# Patient Record
Sex: Female | Born: 1985 | Race: White | Hispanic: No | Smoking: Former smoker
Health system: Southern US, Community
[De-identification: ages and names within clinical notes are randomized; demographics above are authoritative.]

## PROBLEM LIST (undated history)

## (undated) DIAGNOSIS — F419 Anxiety disorder, unspecified: Secondary | ICD-10-CM

## (undated) DIAGNOSIS — R519 Headache, unspecified: Secondary | ICD-10-CM

## (undated) DIAGNOSIS — S66929A Laceration of unspecified muscle, fascia and tendon at wrist and hand level, unspecified hand, initial encounter: Secondary | ICD-10-CM

## (undated) DIAGNOSIS — S61419A Laceration without foreign body of unspecified hand, initial encounter: Secondary | ICD-10-CM

## (undated) DIAGNOSIS — R51 Headache: Secondary | ICD-10-CM

## (undated) DIAGNOSIS — J45909 Unspecified asthma, uncomplicated: Secondary | ICD-10-CM

## (undated) DIAGNOSIS — F4310 Post-traumatic stress disorder, unspecified: Secondary | ICD-10-CM

## (undated) DIAGNOSIS — F329 Major depressive disorder, single episode, unspecified: Secondary | ICD-10-CM

## (undated) DIAGNOSIS — S62309B Unspecified fracture of unspecified metacarpal bone, initial encounter for open fracture: Secondary | ICD-10-CM

## (undated) DIAGNOSIS — F32A Depression, unspecified: Secondary | ICD-10-CM

## (undated) HISTORY — PX: HAND SURGERY: SHX662

## (undated) HISTORY — PX: BONE TUMOR EXCISION: SHX1254

## (undated) HISTORY — PX: WISDOM TOOTH EXTRACTION: SHX21

## (undated) HISTORY — PX: TUBAL LIGATION: SHX77

---
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
## 2006-01-03 ENCOUNTER — Other Ambulatory Visit: Admission: RE | Admit: 2006-01-03 | Discharge: 2006-01-03 | Payer: Self-pay | Admitting: Obstetrics and Gynecology

## 2006-01-12 ENCOUNTER — Encounter (INDEPENDENT_AMBULATORY_CARE_PROVIDER_SITE_OTHER): Payer: Self-pay | Admitting: *Deleted

## 2006-01-12 ENCOUNTER — Ambulatory Visit (HOSPITAL_COMMUNITY): Admission: RE | Admit: 2006-01-12 | Discharge: 2006-01-12 | Payer: Self-pay | Admitting: Obstetrics and Gynecology

## 2006-01-12 HISTORY — PX: DILATION AND CURETTAGE OF UTERUS: SHX78

## 2006-04-14 ENCOUNTER — Inpatient Hospital Stay (HOSPITAL_COMMUNITY): Admission: AD | Admit: 2006-04-14 | Discharge: 2006-04-15 | Payer: Self-pay | Admitting: Obstetrics and Gynecology

## 2006-04-20 ENCOUNTER — Emergency Department (HOSPITAL_COMMUNITY): Admission: EM | Admit: 2006-04-20 | Discharge: 2006-04-21 | Payer: Self-pay | Admitting: Emergency Medicine

## 2006-05-21 ENCOUNTER — Inpatient Hospital Stay (HOSPITAL_COMMUNITY): Admission: AD | Admit: 2006-05-21 | Discharge: 2006-05-21 | Payer: Self-pay | Admitting: Obstetrics and Gynecology

## 2006-06-21 ENCOUNTER — Inpatient Hospital Stay (HOSPITAL_COMMUNITY): Admission: AD | Admit: 2006-06-21 | Discharge: 2006-06-21 | Payer: Self-pay | Admitting: Obstetrics and Gynecology

## 2006-09-27 ENCOUNTER — Observation Stay (HOSPITAL_COMMUNITY): Admission: AD | Admit: 2006-09-27 | Discharge: 2006-09-28 | Payer: Self-pay | Admitting: Obstetrics and Gynecology

## 2006-10-12 ENCOUNTER — Encounter: Admission: RE | Admit: 2006-10-12 | Discharge: 2006-10-30 | Payer: Self-pay | Admitting: Obstetrics and Gynecology

## 2006-11-22 ENCOUNTER — Inpatient Hospital Stay (HOSPITAL_COMMUNITY): Admission: AD | Admit: 2006-11-22 | Discharge: 2006-11-22 | Payer: Self-pay | Admitting: Obstetrics and Gynecology

## 2006-11-25 ENCOUNTER — Inpatient Hospital Stay (HOSPITAL_COMMUNITY): Admission: AD | Admit: 2006-11-25 | Discharge: 2006-11-25 | Payer: Self-pay | Admitting: Obstetrics and Gynecology

## 2006-12-06 ENCOUNTER — Inpatient Hospital Stay (HOSPITAL_COMMUNITY): Admission: AD | Admit: 2006-12-06 | Discharge: 2006-12-09 | Payer: Self-pay | Admitting: Obstetrics and Gynecology

## 2007-03-11 ENCOUNTER — Ambulatory Visit: Payer: Self-pay | Admitting: Family Medicine

## 2007-07-14 ENCOUNTER — Emergency Department (HOSPITAL_COMMUNITY): Admission: EM | Admit: 2007-07-14 | Discharge: 2007-07-14 | Payer: Self-pay | Admitting: Emergency Medicine

## 2007-07-24 ENCOUNTER — Emergency Department (HOSPITAL_COMMUNITY): Admission: EM | Admit: 2007-07-24 | Discharge: 2007-07-24 | Payer: Self-pay | Admitting: Emergency Medicine

## 2008-07-11 ENCOUNTER — Inpatient Hospital Stay (HOSPITAL_COMMUNITY): Admission: AD | Admit: 2008-07-11 | Discharge: 2008-07-11 | Payer: Self-pay | Admitting: Obstetrics and Gynecology

## 2008-09-17 ENCOUNTER — Ambulatory Visit (HOSPITAL_COMMUNITY): Admission: RE | Admit: 2008-09-17 | Discharge: 2008-09-17 | Payer: Self-pay | Admitting: Obstetrics and Gynecology

## 2008-10-08 ENCOUNTER — Ambulatory Visit (HOSPITAL_COMMUNITY): Admission: RE | Admit: 2008-10-08 | Discharge: 2008-10-08 | Payer: Self-pay | Admitting: Obstetrics and Gynecology

## 2008-10-12 ENCOUNTER — Inpatient Hospital Stay (HOSPITAL_COMMUNITY): Admission: AD | Admit: 2008-10-12 | Discharge: 2008-10-12 | Payer: Self-pay | Admitting: Obstetrics and Gynecology

## 2008-10-27 ENCOUNTER — Observation Stay (HOSPITAL_COMMUNITY): Admission: AD | Admit: 2008-10-27 | Discharge: 2008-10-28 | Payer: Self-pay | Admitting: Obstetrics and Gynecology

## 2008-11-06 ENCOUNTER — Inpatient Hospital Stay (HOSPITAL_COMMUNITY): Admission: AD | Admit: 2008-11-06 | Discharge: 2008-11-06 | Payer: Self-pay | Admitting: Obstetrics and Gynecology

## 2008-11-16 ENCOUNTER — Inpatient Hospital Stay (HOSPITAL_COMMUNITY): Admission: AD | Admit: 2008-11-16 | Discharge: 2008-11-19 | Payer: Self-pay | Admitting: Obstetrics and Gynecology

## 2008-11-17 ENCOUNTER — Encounter (INDEPENDENT_AMBULATORY_CARE_PROVIDER_SITE_OTHER): Payer: Self-pay | Admitting: Obstetrics and Gynecology

## 2009-07-09 ENCOUNTER — Ambulatory Visit (HOSPITAL_COMMUNITY): Admission: RE | Admit: 2009-07-09 | Discharge: 2009-07-09 | Payer: Self-pay | Admitting: Family Medicine

## 2009-08-08 ENCOUNTER — Emergency Department (HOSPITAL_COMMUNITY): Admission: EM | Admit: 2009-08-08 | Discharge: 2009-08-08 | Payer: Self-pay | Admitting: Emergency Medicine

## 2010-03-06 ENCOUNTER — Encounter: Payer: Self-pay | Admitting: Obstetrics and Gynecology

## 2010-05-19 LAB — CCBB MATERNAL DONOR DRAW

## 2010-05-19 LAB — CBC
HCT: 26.4 % — ABNORMAL LOW (ref 36.0–46.0)
HCT: 31.7 % — ABNORMAL LOW (ref 36.0–46.0)
Hemoglobin: 10.2 g/dL — ABNORMAL LOW (ref 12.0–15.0)
Hemoglobin: 8.6 g/dL — ABNORMAL LOW (ref 12.0–15.0)
MCHC: 32.2 g/dL (ref 30.0–36.0)
MCHC: 32.8 g/dL (ref 30.0–36.0)
MCV: 80.2 fL (ref 78.0–100.0)
MCV: 80.2 fL (ref 78.0–100.0)
Platelets: 140 10*3/uL — ABNORMAL LOW (ref 150–400)
Platelets: 178 10*3/uL (ref 150–400)
RBC: 3.29 MIL/uL — ABNORMAL LOW (ref 3.87–5.11)
RBC: 3.96 MIL/uL (ref 3.87–5.11)
RDW: 14 % (ref 11.5–15.5)
RDW: 14.3 % (ref 11.5–15.5)
WBC: 12.8 10*3/uL — ABNORMAL HIGH (ref 4.0–10.5)
WBC: 15.9 10*3/uL — ABNORMAL HIGH (ref 4.0–10.5)

## 2010-05-19 LAB — RPR: RPR Ser Ql: NONREACTIVE

## 2010-05-20 LAB — CBC
HCT: 29 % — ABNORMAL LOW (ref 36.0–46.0)
Hemoglobin: 9.7 g/dL — ABNORMAL LOW (ref 12.0–15.0)
MCHC: 33.6 g/dL (ref 30.0–36.0)
MCV: 82.7 fL (ref 78.0–100.0)
Platelets: 212 10*3/uL (ref 150–400)
RBC: 3.5 MIL/uL — ABNORMAL LOW (ref 3.87–5.11)
RDW: 13.7 % (ref 11.5–15.5)
WBC: 9.5 10*3/uL (ref 4.0–10.5)

## 2010-05-20 LAB — STREP B DNA PROBE: Strep Group B Ag: NEGATIVE

## 2010-05-21 LAB — URINALYSIS, ROUTINE W REFLEX MICROSCOPIC
Bilirubin Urine: NEGATIVE
Glucose, UA: NEGATIVE mg/dL
Hgb urine dipstick: NEGATIVE
Ketones, ur: NEGATIVE mg/dL
Nitrite: NEGATIVE
Protein, ur: NEGATIVE mg/dL
Specific Gravity, Urine: 1.015 (ref 1.005–1.030)
Urobilinogen, UA: 0.2 mg/dL (ref 0.0–1.0)
pH: 6.5 (ref 5.0–8.0)

## 2010-05-21 LAB — URINE MICROSCOPIC-ADD ON

## 2010-05-24 LAB — URINALYSIS, ROUTINE W REFLEX MICROSCOPIC
Bilirubin Urine: NEGATIVE
Glucose, UA: NEGATIVE mg/dL
Hgb urine dipstick: NEGATIVE
Ketones, ur: 40 mg/dL — AB
Nitrite: NEGATIVE
Protein, ur: NEGATIVE mg/dL
Specific Gravity, Urine: 1.025 (ref 1.005–1.030)
Urobilinogen, UA: 1 mg/dL (ref 0.0–1.0)
pH: 6.5 (ref 5.0–8.0)

## 2010-07-01 NOTE — Op Note (Signed)
NAME:  [name], [name]             ACCOUNT NO.:  [id number]   MEDICAL RECORD NO.:  [id number]          PATIENT TYPE:  AMB   LOCATION:  SDC                           F[facility]ILITY:  [facility]   PHYSICIAN:  James A. Ashley Royalty, M.D.DATE OF BIRTH:  Apr 08, 1985   DATE OF PROCEDURE:  01/12/2006  DATE OF DISCHARGE:  01/12/2006                               OPERATIVE REPORT   PREOPERATIVE DIAGNOSIS:  Missed abortion at approximately [redacted] weeks  gestation with twin gestation.   POSTOPERATIVE DIAGNOSIS:  Missed abortion at approximately [redacted] weeks  gestation with twin gestation.  Pathology pending.   PROCEDURE:  Suction dilatation and curettage.   SURGEON:  Rudy Jew. Ashley Royalty, MD   ANESTHESIA:  General.   ESTIMATED BLOOD LOSS:  50 mL.   COMPLICATIONS:  None.   PACKS/DRAINS:  None.   PROCEDURE:  The patient was taken to the operating room and placed in  the dorsal supine position.  After general anesthetic was administered,  she was placed in the lithotomy position and prepped and draped in usual  manner for vaginal surgery.  Posterior weighted retractor was placed per  vagina.  The anterior lip of the cervix was grasped with single-tooth  tenaculum.  The uterus was sounded to approximately 9 cm.  The cervix  was then serially dilated to a size 27-French using Pratt dilators.  9  mm suction curette was then introduced into the uterine cavity and  suction applied.  A moderate amount of apparent products of conception  was delivered to the tubing.  After several passes with the suction  curette, no additional tissue was obtained.  At this point medium size  curette was introduced into the uterine cavity and a gentle sharp  curettage was performed.  No additional tissue was obtained.  One  additional pass with the suction curette again revealed no additional  tissue.  This point the patient was felt to have benefited maximally  from the surgical procedure.  The single-tooth tenaculum was removed and  a  small laceration at the tenaculum site easily closed with a 2-0  chromic catgut interrupted suture.  Hemostasis was noted and procedure  terminated.   The patient was taken to the recovery room in excellent condition.      James A. Ashley Royalty, M.D.  Electronically Signed    JAM/MEDQ  D:  01/14/2006  T:  01/15/2006  Job:  [id number]

## 2010-07-01 NOTE — Discharge Summary (Signed)
NAMECHRISANNE, [name]              ACCOUNT NO.:  [id number]   MEDICAL RECORD NO.:  [id number]          PATIENT TYPE:  INP   LOCATION:  9127                          [facility]ILITY:  [facility]   PHYSICIAN:  Huel Cote, M.D. DATE OF BIRTH:  01/07/1986   DATE OF ADMISSION:  12/06/2006  DATE OF DISCHARGE:  12/09/2006                               DISCHARGE SUMMARY   DISCHARGE DIAGNOSES:  1. Term pregnancy at 70 and four-sevenths weeks delivered.  2. Status post normal spontaneous vaginal delivery.   DISCHARGE MEDICATIONS:  1. Motrin 600 mg p.o. every 6 hours.  2. Percocet one to two tablets p.o. every 4 hours p.r.n.   DISCHARGE FOLLOWUP:  The patient is to follow up in the office in 6  weeks for her routine postpartum exam.   HOSPITAL COURSE:  The patient is a 25 year old G2, P0-0-1-0 who was  admitted at 53 and four-sevenths weeks with painful regular  contractions.  Her pregnancy had been largely uncomplicated and she was  admitted for observation x1 with an abdominal trauma in the pregnancy  with no negative consequences.  Past medical history:  Anxiety,  comfortable without medications; asthma, no medications.  Past surgical  history:  D&C.  Past obstetrical history:  She had had one SAB which  required a D&C as stated.  Past GYN history:  None.  Medications  included albuterol p.r.n. and prenatal vitamins.  Allergies included  some STEROIDS and LATEX.  Prenatal labs are as follows:  A positive,  antibody negative, RPR nonreactive, rubella immune, hepatitis B surface  antigen negative, HIV negative, GC negative, chlamydia negative, group B  strep negative, 1-hour Glucola 87.  On admission, she was afebrile with  stable vital signs.  Fetal heart rate was reactive.  Cervix was 1, 50  and a -2 and progressed to 2, 80 and a -2.  She had assisted rupture of  membranes with clear fluid noted.  Cervix at that time was 2-3 and 90.  She continued to progress well and reached complete dilation,  pushed  excellently, with a normal spontaneous vaginal delivery of a vigorous  female infant over an intact perineum.  Apgars were 8 and 9, weight was  6 pounds 7 ounces.  Placenta delivered spontaneously.  She had a small  right laceration which was repaired with interrupted 3-0 Vicryl for  hemostasis.  Cervix, rectum and vagina were all intact.  Postpartum  hemoglobin was 8.2, down from 10.2.  By postpartum day #2 she was doing  quite well, she had no complaints and her pain was well controlled with  p.o. medications, and she was felt stable for discharge home.      Huel Cote, M.D.  Electronically Signed     KR/MEDQ  D:  01/17/2007  T:  01/17/2007  Job:  [id number]

## 2010-07-01 NOTE — H&P (Signed)
NAME:  [name], [name]             ACCOUNT NO.:  [id number]   MEDICAL RECORD NO.:  [id number]          PATIENT TYPE:  AMB   LOCATION:  SDC                           [facility]   PHYSICIAN:  James A. Ashley Royalty, M.D.DATE OF BIRTH:  1985-12-24   DATE OF ADMISSION:  01/12/2006  DATE OF DISCHARGE:                              HISTORY & PHYSICAL   Please have this dictation available on the outpatient surgery area at  [facility] at 3 p.m.   This is a 25 year old gravid 1, who presented for ultrasound yesterday  in order to date her early pregnancy.  The ultrasound revealed a twin  gestation at or about 9 weeks 2 days with demise of both twins.  The  patient is hence for suction dilatation and curettage.   MEDICATIONS:  Prenatal vitamins.   PAST MEDICAL HISTORY:  1. Medical negative.  2. Surgical negative.   ALLERGIES:  LATEX and possibly STEROIDS.   FAMILY HISTORY:  Noncontributory.   SOCIAL HISTORY:  The patient denies the use of tobacco or significant  alcohol.   REVIEW OF SYSTEMS:  Noncontributory.   PHYSICAL EXAMINATION:  GENERAL:  Well-developed, well-nourished pleasant  white female in no acute distress.  VITAL SIGNS:  Afebrile, vital signs stable.  CHEST:  Lungs are clear.  CARDIAC:  Regular rate and rhythm.  ABDOMEN:  Soft and nontender.  PELVIC:  Please see most recent office evaluation.  EXTERNAL GENITALIA:  Within normal limits.  VAGINA AND CERVIX:  Without gross lesions.  Bimanual examination reveals  uterus to be approximately 8 x 4.4 cm, and no adnexal masses are  palpable.   IMPRESSION:  1. Intrauterine pregnancy at approximately 9 weeks 2 days gestation.  2. Twin gestation.  3. Fetal demise of both twins (missed abortion).   PLAN:  Suction dilatation and curettage.  Risks, benefits,  complications, and alternatives were discussed with the patient.  She  states she understands and accepts.  Questions invited and answered.      James A.  Ashley Royalty, M.D.  Electronically Signed     JAM/MEDQ  D:  01/12/2006  T:  01/12/2006  Job:  [id number]

## 2010-07-01 NOTE — H&P (Signed)
NAME:  [name], [name] NO.:  [id number]   MEDICAL RECORD NO.:  [id number]          PATIENT TYPE:  IPS   LOCATION:  0501                          [facility]   PHYSICIAN:  Anselm Jungling, MD  DATE OF BIRTH:  05-25-1985   DATE OF ADMISSION:  01/19/2005  DATE OF DISCHARGE:                         PSYCHIATRIC ADMISSION ASSESSMENT   IDENTIFYING INFORMATION:  An 25 year old single white female, voluntarily  admitted on January 19, 2005.   HISTORY OF PRESENT ILLNESS:  The patient presents with a history of  depression with a self-inflicted injury.  The patient has been cutting  herself with a razor.  The patient states that circumstances prior to  admission, she had gone for her Depo-Provera injection.  Providers there  asked the patient if she was having any symptoms of depression.  The patient  said that she was.  They were concerned and sent her to the mental health  center for assessment.  The patient reports she has been having symptoms of  feeling very angry, hates her life and wants to die.  The patient has been  experiencing positive auditory hallucinations, hearing conversations.  She  is having difficulty with initial insomnia.  Her appetite has been  satisfactory.  The patient reports that she also has a history of biting her  nails, picking at her skin because of ongoing anxiety.  She has a history of  punching walls, yelling and instigating fights.   PAST PSYCHIATRIC HISTORY:  First admission to [facility].  In  the past has been on Celexa and Paxil.   SOCIAL HISTORY:  She is an 25 year old female with no children, currently  living with her parents.  Has a 12th grade education.  Works in a [employer], [employer].  No legal issues.   FAMILY HISTORY:  Father with depression, sister who is bipolar.   ALCOHOL DRUG HISTORY:  The patient smokes.  She takes pain pills on  occasion.  No consistent alcohol use.   PAST MEDICAL HISTORY:   Primary care Amijah Timothy:  None.  Medical problems are  none.   MEDICATIONS:  The patient reports has been on Celexa, Zyprexa and Wellbutrin  in the past.  Last time she has been on medication was at the age of 43.   DRUG ALLERGIES:  LATEX.   PHYSICAL EXAMINATION:  Review of systems:  Denies any chest pain, shortness  of breath, nausea and vomiting, sexually active, currently on birth control  injection.  She is reporting depression and anxiety with suicidal ideation,  self-inflicted injuries.  GENERAL APPEARANCE:  This is a healthy young female in no acute distress.  Her temperature is 96.5, 86 heart rate, 20 respirations, blood pressure  107/69.  She is 129 pounds, 5 feet 3 inches tall.  Negative lymphadenopathy.  CHEST:  Clear.  HEART:  Regular rate and rhythm.  ABDOMEN:  Soft, nontender abdomen.  EXTREMITIES:  The patient moves all extremities, no clubbing or deformities,  no contusions.  SKIN:  Warm and dry.  The patient has a tattoo in the back on her neck.  There are superficial lacerations to  her left wrist.  NEUROLOGICAL:  Intact, nonfocal.  Easily performs heel-to-shin, normal  alternating movements.   LABORATORY DATA:  Her CBC is within normal limits.  CMET within normal  limits.  TSH is 1.933.   MENTAL STATUS EXAM:  She is alert, cooperative female, good eye contact,  casually dressed.  Speech is clear.  The patient feels depressed and  anxious.  The patient does appear with some mild anxiety.  Thought processes  are coherent, the patient is endorsing positive auditory hallucinations  although does not appear to be actively responding at this time.  Cognitive  function intact.  Memory is good, judgment is fair, insight is fair.  Poor  impulse control, average intelligence, and concentration is intact.   ADMISSION DIAGNOSES:  AXIS I:  Mood disorder not otherwise specified, rule  out bipolar disorder not otherwise specified.  AXIS II:  Deferred.  AXIS III:  None.  AXIS  IV:  Other psychosocial problems.  AXIS V:  Current is 30.   PLAN:  Plan is to stabilize mood and thinking, contract for safety.  We will  start Depakote and Risperdal, will have a family session with parents for  concerns and education.  The patient may need an  individual therapist.   TENTATIVE LENGTH OF CARE:  3-4 days.      Landry Corporal, N.P.      Anselm Jungling, MD  Electronically Signed    JO/MEDQ  D:  01/23/2005  T:  01/23/2005  Job:  [id number]

## 2010-07-01 NOTE — Discharge Summary (Signed)
NAME:  [name], [name] NO.:  [id number]   MEDICAL RECORD NO.:  [id number]          PATIENT TYPE:  IPS   LOCATION:  0501                          [facility]   PHYSICIAN:  Anselm Jungling, MD  DATE OF BIRTH:  03-05-1985   DATE OF ADMISSION:  01/19/2005  DATE OF DISCHARGE:                                 DISCHARGE SUMMARY   IDENTIFYING DATA AND REASON FOR ADMISSION:  The patient is an 25 year old  single white female who lives with her parents in [place], [place].  She was admitted, her first inpatient psychiatric hospitalization ever, due  to increasing depression, suicidal ideation, and self-inflicted cutting. She  had had similar problems since age 23. She was not at the time in any  outpatient treatment, nor was she taking any psychotropic medication for the  above. She denied any specific stressors. Please refer to the admission note  for further details pertaining to the symptoms, circumstances and history  that led to her hospitalization. 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   MEDICAL AND LABORATORY:  The patient was physically assessed by the  psychiatric nurse practitioner upon admission. There were no significant  medical issues during this brief inpatient psychiatric stay.   HOSPITAL COURSE:  The patient was admitted to the adult inpatient  psychiatric service where she participated in various therapeutic groups and  activities designed to help her acquire better coping skills, better  understanding of her underlying disorders and dynamics, and the development  of an aftercare and safety plan. Due to suspected bipolar disorder, she was  started on a regimen of low-dose Risperdal at 0.25 mg b.i.d., and Depakote  ER 500 mg q.h.s.  These were well tolerated, and the patient reported much  less in the way of anxiety, agitation, improved sleep, and reduction in any  impulses to harm herself. She was a good participant in the treatment  program and got along well with peers and staff alike.   During her stay, there was a family session involving the patient and her  parents. This was a productive meeting in which the antecedents to her  hospitalization were discussed as well as the patient's current treatment  and aftercare needs. The parents were highly motivated to see their daughter  pursue outpatient treatment following her inpatient stabilization.   By the fourth hospital day, the patient indicated that she was absent any  thoughts of self-harm or suicide. She was interested in going home that day,  but it was felt a bit premature as there was greater need for coordination  of her discharge with her parents and aftercare planning.   AFTERCARE:  The patient was discharged on the fifth hospital day. Discharge  medications include Depakote ER 500 mg q.h.s., and Risperdal 0.25 mg b.i.d..  outpatient aftercare appointments had not yet been set up at the time of  this dictation.   DISCHARGE DIAGNOSES:  AXIS I: Depressive disorder, not otherwise specified,  rule out bipolar disorder, type 2.  AXIS II: Borderline personality traits.  AXIS III: No acute or chronic illnesses.  AXIS IV: Stressors severe.  AXIS V: Global assessment of functioning on discharge is 65.           ______________________________  Anselm Jungling, MD  Electronically Signed     SPB/MEDQ  D:  01/23/2005  T:  01/23/2005  Job:  [id number]

## 2010-11-23 LAB — CBC
HCT: 24.1 — ABNORMAL LOW
HCT: 30 — ABNORMAL LOW
Hemoglobin: 10.2 — ABNORMAL LOW
Hemoglobin: 8.2 — ABNORMAL LOW
MCHC: 33.9
MCHC: 34.1
MCV: 80.2
MCV: 84.4
Platelets: 154
Platelets: 175
RBC: 2.86 — ABNORMAL LOW
RBC: 3.74 — ABNORMAL LOW
RDW: 14.3 — ABNORMAL HIGH
RDW: 14.7 — ABNORMAL HIGH
WBC: 15.6 — ABNORMAL HIGH
WBC: 17.6 — ABNORMAL HIGH

## 2010-11-23 LAB — RPR: RPR Ser Ql: NONREACTIVE

## 2010-11-23 LAB — CCBB MATERNAL DONOR DRAW

## 2010-11-24 LAB — URINALYSIS, ROUTINE W REFLEX MICROSCOPIC
Bilirubin Urine: NEGATIVE
Bilirubin Urine: NEGATIVE
Glucose, UA: NEGATIVE
Glucose, UA: NEGATIVE
Hgb urine dipstick: NEGATIVE
Hgb urine dipstick: NEGATIVE
Ketones, ur: NEGATIVE
Ketones, ur: NEGATIVE
Nitrite: NEGATIVE
Nitrite: NEGATIVE
Protein, ur: NEGATIVE
Protein, ur: NEGATIVE
Specific Gravity, Urine: 1.02
Specific Gravity, Urine: 1.025
Urobilinogen, UA: 0.2
Urobilinogen, UA: 0.2
pH: 6.5
pH: 6.5

## 2010-11-24 LAB — COMPREHENSIVE METABOLIC PANEL
ALT: 11
ALT: 15
AST: 19
AST: 23
Albumin: 2.5 — ABNORMAL LOW
Albumin: 2.6 — ABNORMAL LOW
Alkaline Phosphatase: 163 — ABNORMAL HIGH
Alkaline Phosphatase: 165 — ABNORMAL HIGH
BUN: 10
BUN: 6
CO2: 22
CO2: 22
Calcium: 8.6
Calcium: 8.7
Chloride: 104
Chloride: 109
Creatinine, Ser: 0.47
Creatinine, Ser: 0.48
GFR calc Af Amer: >60
GFR calc Af Amer: >60
GFR calc non Af Amer: >60
GFR calc non Af Amer: >60
Glucose, Bld: 71
Glucose, Bld: 75
Potassium: 3.8
Potassium: 4
Sodium: 132 — ABNORMAL LOW
Sodium: 136
Total Bilirubin: 0.3
Total Bilirubin: 0.6
Total Protein: 5.6 — ABNORMAL LOW
Total Protein: 5.7 — ABNORMAL LOW

## 2010-11-24 LAB — CBC
HCT: 29.1 — ABNORMAL LOW
HCT: 29.5 — ABNORMAL LOW
Hemoglobin: 10 — ABNORMAL LOW
Hemoglobin: 10.1 — ABNORMAL LOW
MCHC: 34.1
MCHC: 34.3
MCV: 81.4
MCV: 82.3
Platelets: 176
Platelets: 198
RBC: 3.58 — ABNORMAL LOW
RBC: 3.59 — ABNORMAL LOW
RDW: 13.8
RDW: 13.9
WBC: 10.1
WBC: 11.4 — ABNORMAL HIGH

## 2010-11-24 LAB — URIC ACID
Uric Acid, Serum: 3.8
Uric Acid, Serum: 4.1

## 2010-11-24 LAB — LACTATE DEHYDROGENASE
LDH: 156
LDH: 162

## 2010-11-28 LAB — URINALYSIS, ROUTINE W REFLEX MICROSCOPIC
Bilirubin Urine: NEGATIVE
Glucose, UA: NEGATIVE
Hgb urine dipstick: NEGATIVE
Ketones, ur: NEGATIVE
Nitrite: NEGATIVE
Protein, ur: NEGATIVE
Specific Gravity, Urine: <1.005 — ABNORMAL LOW
Urobilinogen, UA: 0.2
pH: 6

## 2010-11-28 LAB — WET PREP, GENITAL
Clue Cells Wet Prep HPF POC: NONE SEEN
Trich, Wet Prep: NONE SEEN
Yeast Wet Prep HPF POC: NONE SEEN

## 2010-11-28 LAB — URINE MICROSCOPIC-ADD ON

## 2013-01-17 ENCOUNTER — Other Ambulatory Visit: Payer: Self-pay

## 2013-01-27 ENCOUNTER — Other Ambulatory Visit (HOSPITAL_COMMUNITY): Payer: Self-pay | Admitting: Obstetrics and Gynecology

## 2013-01-27 DIAGNOSIS — O283 Abnormal ultrasonic finding on antenatal screening of mother: Secondary | ICD-10-CM

## 2013-01-29 ENCOUNTER — Ambulatory Visit (HOSPITAL_COMMUNITY)
Admission: RE | Admit: 2013-01-29 | Discharge: 2013-01-29 | Disposition: A | Discharge status: Home / Self Care | Payer: Medicaid Other | Source: Ambulatory Visit | Attending: Obstetrics and Gynecology | Admitting: Obstetrics and Gynecology

## 2013-01-29 ENCOUNTER — Encounter (HOSPITAL_COMMUNITY): Payer: Self-pay

## 2013-01-29 DIAGNOSIS — O358XX0 Maternal care for other (suspected) fetal abnormality and damage, not applicable or unspecified: Secondary | ICD-10-CM | POA: Insufficient documentation

## 2013-01-29 DIAGNOSIS — O283 Abnormal ultrasonic finding on antenatal screening of mother: Secondary | ICD-10-CM

## 2013-01-29 DIAGNOSIS — O43899 Other placental disorders, unspecified trimester: Secondary | ICD-10-CM | POA: Insufficient documentation

## 2013-01-29 DIAGNOSIS — Z1389 Encounter for screening for other disorder: Secondary | ICD-10-CM | POA: Insufficient documentation

## 2013-01-29 DIAGNOSIS — Z363 Encounter for antenatal screening for malformations: Secondary | ICD-10-CM | POA: Insufficient documentation

## 2013-01-30 ENCOUNTER — Other Ambulatory Visit (HOSPITAL_COMMUNITY): Payer: Self-pay | Admitting: Obstetrics and Gynecology

## 2013-01-30 DIAGNOSIS — O43899 Other placental disorders, unspecified trimester: Secondary | ICD-10-CM

## 2013-02-03 ENCOUNTER — Other Ambulatory Visit: Payer: Self-pay

## 2013-02-03 ENCOUNTER — Ambulatory Visit (HOSPITAL_COMMUNITY)
Admission: RE | Admit: 2013-02-03 | Discharge: 2013-02-03 | Disposition: A | Discharge status: Home / Self Care | Payer: Medicaid Other | Source: Ambulatory Visit | Attending: Obstetrics and Gynecology | Admitting: Obstetrics and Gynecology

## 2013-02-03 DIAGNOSIS — O409XX0 Polyhydramnios, unspecified trimester, not applicable or unspecified: Secondary | ICD-10-CM | POA: Insufficient documentation

## 2013-02-03 DIAGNOSIS — IMO0002 Reserved for concepts with insufficient information to code with codable children: Secondary | ICD-10-CM | POA: Insufficient documentation

## 2013-02-03 DIAGNOSIS — O43899 Other placental disorders, unspecified trimester: Secondary | ICD-10-CM

## 2013-02-03 DIAGNOSIS — O358XX0 Maternal care for other (suspected) fetal abnormality and damage, not applicable or unspecified: Secondary | ICD-10-CM | POA: Insufficient documentation

## 2013-02-04 NOTE — Progress Notes (Signed)
Genetic Counseling  High-Risk Gestation Note  Appointment Date:  02/03/2013 Referred By: Sherron Monday, MD Date of Birth:  1985/09/26 Partner:  Brien Few    Pregnancy History: [id number] Estimated Date of Delivery: 04/11/13 Estimated Gestational Age: [redacted]w[redacted]d Attending: Damaris Hippo, MD   I met with Mrs. AMIAYA MCNEELEY and her husband, Mr. Dallys Nowakowski, for genetic counseling because of previous abnormal ultrasound findings.  We began by reviewing the ultrasound in detail. 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 Remaining visualized fetal anatomy appeared normal. See separate complete ultrasound report for complete results including discussion of placental findings. Follow-up ultrasound was performed at the time of today's visit. Polyhydramnios with no clear evidence of a fluid filled stomach was again visualized. Complete ultrasound results from today's ultrasound also reported under separate cover.   Polyhydramnios is defined as an excess amount of amniotic fluid for the gestational age. Polyhydramnios is observed in 1% of pregnancies, and can be seen for multiple reasons including pregnant women who have diabetes mellitus (DM) and in obese women. They were counseled that polyhydramnios can also be due to a fetal anomaly, most commonly an obstructive abnormality such as duodental atresia or TE fistula/esophageal atresia. Polyhydramnios can also be the result of an underlying genetic etiology.   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 Abnormalities of the esophagus and the trachea occur in approximately 1 in 3,000 births, and approximately 90% result in the upper portion of the esophagus ending in a blind pouch (esophageal atresia) and the lower  segment forming a fistula with the trachea. There is an increased risk for associated anomalies. Esophageal atresia with/without TE fistula can be seen as an isolated occurrence or can be a feature of an underlying syndrome or association. When isolated multifactorial inheritance is suspected. Teratogenic exposures have also been associated with esophageal atresia/TE fistula, such as alcohol and retinoic acid. Over 40 genetic, chromosome, and sporadic syndromes have been associated with esophageal atresia and/or tracheoesophageal fistula. VACTERL association is most commonly reported in cases that are syndromic. Additionally, approximately 2-3% of cases reportedly are due to trisomy 33. We discussed that management of esophageal atresia/TE fistula, if confirmed to be present, involves surgical correction. We discussed the option of meeting with a pediatric surgeon in the pregnancy to further discuss the postnatal treatment regarding TE fistula. The couple expressed interest in this. Prenatal consultation with [facility] is scheduled for 02/19/13. We briefly discussed the ultrasound finding of umbilical vein varix, which is a focal dilatation of the umbilical vein. Available literature indicates that approximately 10% of cases are associated with an underlying chromosome condition.   We discussed the associated increase in risk for chromosome aberrations and single gene causes. We reviewed, chromosomes, nondisjunction, genes, and common inheritance patterns (autosomal recessive and dominant). We discussed examples of chromosome conditions such as Trisomy 19, Trisomy 71, and Trisomy 13, including the variable features and prognoses.   In addition, we discussed the risk for other chromosome aberrations including microdeletions (22q11 deletion syndrome), duplications, insertions, and translocations. We also discussed that the ultrasound findings could be sporadic, due to multifactorial cause,  or due to a teratogenic cause. Mrs. Hagger reported no exposures to known teratogens during her pregnancy.   Mrs. Borgen previously had first trimester screening through her OB office, which was within normal range for Down  syndrome and Trisomy 18, reducing the risk for each to less than 1 in 10,000. They understand that screening adjusts the a priori risk to provide a pregnancy specific risk, but does not diagnose or rule out these conditions. We discussed additional screening and testing options for fetal aneuploidy. Regarding screening tests, we discussed the option of noninvasive prenatal screening (NIPS)/cell free DNA testing. Specifically, we discussed the methodology of the testing, the conditions for which it screens, the detection rates, and false positive rates of each. We discussed the option of an extended panel for certain microdeletion syndromes through NIPS. However, we discussed that data are limited regarding the performance of NIPS for these conditions and that the sensitivity and specificity are lower than for fetal trisomy conditions. They understand that NIPS does not diagnose or rule out the conditions for which it screens.   We also discussed the diagnostic testing option of amniocentesis. We reviewed the risks, benefits, and limitations. We discussed the associated 1 in 300-500 risk for complications, including spontaneous preterm labor and delivery. Karyotype analysis can be performed from amniocentesis. We also discussed the option of chromosomal microarray analysis from amniocentesis. The couple was counseled that chromosomal microarray analysis is a molecular based technique in which a test sample of DNA (fetal) is compared to a reference (normal) genome in order to determine if the test sample has any extra or missing genetic information.  Microarray analysis allows for the detection of genetic deletions and duplications that are 100 times smaller than those identified by routine  chromosome analysis.  Medical literature shows that approximately 6% of patients with an abnormal fetal ultrasound and a normal fetal karyotype had a significant microdeletion/microduplication detected by prenatal microarray analysis.  However, we discussed that the significance of the identified copy number variant may not be known. We discussed that single gene conditions are not routinely tested for by amniocentesis, unless ultrasound or family history are strongly suggestive of a particular condition for which testing is available. The couple understands that postnatal evaluation by a medical geneticist and genetic testing, if warranted, would also be available after delivery. After careful consideration, Mrs. Rembold elected to proceed with NIPS ([facility]or[facility]) at the time of today's visit and declined amniocentesis at this time.   Mrs. Picinich is scheduled for follow-up ultrasounds in our office given the ultrasound findings. Additionally, the option of transfer of care to [facility] for delivery at [facility] was discussed. Transfer of care visits are tentatively being planned for 02/19/13.   Both family histories were reviewed and found to be contributory for a maternal aunt to the patient with a history of nine pregnancy losses, in addition to a healthy daughter. A specific cause was not known for her miscarriages. Approximately 1 in 6 confirmed pregnancies results in miscarriage. A single underlying cause is more likely to be suspected when a couple has experienced 3 or more losses. It is less likely that there will be an identifiable single underlying cause when a couple has experienced less than 3 losses. We discussed several possible causes including chromosome rearrangements, antibodies, and thrombophilia. We discussed that the majority of cases would not have implications for extended relatives. However, additional information is needed regarding the etiology in order to  more accurately assess potential implications for relatives. The family histories were otherwise unremarkable for birth defects, intellectual disability, and known genetic conditions. Without further information regarding the provided family history, an accurate genetic risk cannot be calculated. Further genetic counseling is  warranted if more information is obtained.  Mrs. Vohs was provided with written information regarding cystic fibrosis (CF) including the carrier frequency and incidence in the Caucasian population, the availability of carrier testing and prenatal diagnosis if indicated.  In addition, we discussed that CF is routinely screened for as part of the [place] newborn screening panel.  She declined testing today.   Mrs. Milford denied exposure to environmental toxins or chemical agents. She denied the use of alcohol, tobacco or street drugs. She denied significant viral illnesses during the course of her pregnancy. Her medical and surgical histories were contributory for asthma. She reported that she is not currently taking medication for treatment of asthma.    I counseled this couple regarding the above risks and available options.  The approximate face-to-face time with the genetic counselor was 45 minutes.  Quinn Plowman, MS Certified Genetic Counselor 02/04/2013

## 2013-02-05 ENCOUNTER — Ambulatory Visit (HOSPITAL_COMMUNITY): Admission: RE | Admit: 2013-02-05 | Payer: Medicaid Other | Source: Ambulatory Visit

## 2013-02-05 ENCOUNTER — Other Ambulatory Visit (HOSPITAL_COMMUNITY): Payer: Self-pay | Admitting: Obstetrics and Gynecology

## 2013-02-05 ENCOUNTER — Encounter (HOSPITAL_COMMUNITY): Payer: Medicaid Other

## 2013-02-05 DIAGNOSIS — O358XX0 Maternal care for other (suspected) fetal abnormality and damage, not applicable or unspecified: Secondary | ICD-10-CM

## 2013-02-11 ENCOUNTER — Ambulatory Visit (HOSPITAL_COMMUNITY)
Admission: RE | Admit: 2013-02-11 | Discharge: 2013-02-11 | Disposition: A | Discharge status: Home / Self Care | Payer: Medicaid Other | Source: Ambulatory Visit | Attending: Obstetrics and Gynecology | Admitting: Obstetrics and Gynecology

## 2013-02-11 DIAGNOSIS — O358XX0 Maternal care for other (suspected) fetal abnormality and damage, not applicable or unspecified: Secondary | ICD-10-CM | POA: Insufficient documentation

## 2013-02-11 DIAGNOSIS — O43899 Other placental disorders, unspecified trimester: Secondary | ICD-10-CM | POA: Insufficient documentation

## 2013-02-12 ENCOUNTER — Telehealth (HOSPITAL_COMMUNITY): Payer: Self-pay | Admitting: MS"

## 2013-02-12 NOTE — Telephone Encounter (Signed)
Called [name] to discuss her cell free fetal DNA test results.  Mrs. COLIE JOSTEN had Pan[facility] testing through [facility].  Testing was offered because of abnormal ultrasound findings.   The patient was identified by name and DOB.  We reviewed that these are within normal limits, showing a less than 1 in 10,000 risk for trisomies 21, 18 and 13, and monosomy X (Turner syndrome).  In addition, the risk for triploidy/vanishing twin and sex chromosome trisomies (47,XXX and 47,XXY) was also low risk.  Ms. Fry elected to have cfDNA analysis for the extended panel which included five microdeletion syndromes, all of which were low risk:  22q11 deletion syndrome  (<1 in 13,300), 1p36 deletion syndrome (1 in 12,400), Angelman syndrome (1 in 16,600), Cri-du-chat syndrome (1 in 57,100), and Prader-Willi syndrome (1 in 13,800).  We reviewed that this testing identifies > 99% of pregnancies with trisomy 56, trisomy 40, sex chromosome trisomies (47,XXX and 47,XXY), and triploidy. The detection rate for trisomy 18 is 96%.  The detection rate for monosomy X is ~92%.  The false positive rate is <0.1% for all conditions. We reviewed that the detection rates for microdeletion syndromes is lower, approximately 60-80% depending upon the size of the deletion. Testing was also consistent with female gender.  She understands that this testing does not identify all genetic conditions.  All questions were answered to her satisfaction, she was encouraged to call with additional questions or concerns.  Quinn Plowman, MS Certified Genetic Counselor 02/12/2013 2:51 PM

## 2013-02-14 ENCOUNTER — Ambulatory Visit (HOSPITAL_COMMUNITY)
Admission: RE | Admit: 2013-02-14 | Discharge: 2013-02-14 | Disposition: A | Discharge status: Home / Self Care | Payer: Medicaid Other | Source: Ambulatory Visit | Attending: Obstetrics and Gynecology | Admitting: Obstetrics and Gynecology

## 2013-02-14 ENCOUNTER — Other Ambulatory Visit (HOSPITAL_COMMUNITY): Payer: Self-pay | Admitting: Obstetrics and Gynecology

## 2013-02-14 DIAGNOSIS — Q899 Congenital malformation, unspecified: Secondary | ICD-10-CM

## 2013-03-07 ENCOUNTER — Inpatient Hospital Stay (HOSPITAL_COMMUNITY): Admit: 2013-03-07 | Payer: Self-pay | Admitting: Obstetrics and Gynecology

## 2013-03-07 ENCOUNTER — Encounter (HOSPITAL_COMMUNITY): Payer: Self-pay

## 2013-03-07 SURGERY — Surgical Case
Anesthesia: Regional

## 2013-12-04 ENCOUNTER — Encounter (HOSPITAL_COMMUNITY): Payer: Self-pay | Admitting: *Deleted

## 2013-12-15 ENCOUNTER — Encounter (HOSPITAL_COMMUNITY): Payer: Self-pay | Admitting: *Deleted

## 2014-02-24 ENCOUNTER — Emergency Department (HOSPITAL_COMMUNITY): Payer: Medicaid Other

## 2014-02-24 ENCOUNTER — Emergency Department (HOSPITAL_COMMUNITY)
Admission: EM | Admit: 2014-02-24 | Discharge: 2014-02-24 | Disposition: A | Discharge status: Home / Self Care | Payer: Medicaid Other | Attending: Emergency Medicine | Admitting: Emergency Medicine

## 2014-02-24 ENCOUNTER — Encounter (HOSPITAL_COMMUNITY): Payer: Self-pay | Admitting: Emergency Medicine

## 2014-02-24 DIAGNOSIS — Z87891 Personal history of nicotine dependence: Secondary | ICD-10-CM | POA: Insufficient documentation

## 2014-02-24 DIAGNOSIS — N390 Urinary tract infection, site not specified: Secondary | ICD-10-CM

## 2014-02-24 DIAGNOSIS — Z91040 Latex allergy status: Secondary | ICD-10-CM | POA: Insufficient documentation

## 2014-02-24 DIAGNOSIS — Z3202 Encounter for pregnancy test, result negative: Secondary | ICD-10-CM | POA: Insufficient documentation

## 2014-02-24 DIAGNOSIS — R1033 Periumbilical pain: Secondary | ICD-10-CM | POA: Insufficient documentation

## 2014-02-24 DIAGNOSIS — R109 Unspecified abdominal pain: Secondary | ICD-10-CM | POA: Diagnosis present

## 2014-02-24 DIAGNOSIS — Z79899 Other long term (current) drug therapy: Secondary | ICD-10-CM | POA: Insufficient documentation

## 2014-02-24 DIAGNOSIS — R110 Nausea: Secondary | ICD-10-CM | POA: Diagnosis not present

## 2014-02-24 DIAGNOSIS — R112 Nausea with vomiting, unspecified: Secondary | ICD-10-CM

## 2014-02-24 LAB — URINALYSIS, ROUTINE W REFLEX MICROSCOPIC
Bilirubin Urine: NEGATIVE
Glucose, UA: NEGATIVE mg/dL
Hgb urine dipstick: NEGATIVE
Ketones, ur: 15 mg/dL — AB
Leukocytes, UA: NEGATIVE
Nitrite: POSITIVE — AB
Protein, ur: NEGATIVE mg/dL
Specific Gravity, Urine: 1.02 (ref 1.005–1.030)
Urobilinogen, UA: 0.2 mg/dL (ref 0.0–1.0)
pH: 7 (ref 5.0–8.0)

## 2014-02-24 LAB — CBC WITH DIFFERENTIAL/PLATELET
Basophils Absolute: 0 10*3/uL (ref 0.0–0.1)
Basophils Relative: 0 % (ref 0–1)
Eosinophils Absolute: 0 10*3/uL (ref 0.0–0.7)
Eosinophils Relative: 0 % (ref 0–5)
HCT: 33.7 % — ABNORMAL LOW (ref 36.0–46.0)
Hemoglobin: 11.3 g/dL — ABNORMAL LOW (ref 12.0–15.0)
Lymphocytes Relative: 5 % — ABNORMAL LOW (ref 12–46)
Lymphs Abs: 0.5 10*3/uL — ABNORMAL LOW (ref 0.7–4.0)
MCH: 28.1 pg (ref 26.0–34.0)
MCHC: 33.5 g/dL (ref 30.0–36.0)
MCV: 83.8 fL (ref 78.0–100.0)
Monocytes Absolute: 0.4 10*3/uL (ref 0.1–1.0)
Monocytes Relative: 3 % (ref 3–12)
Neutro Abs: 10 10*3/uL — ABNORMAL HIGH (ref 1.7–7.7)
Neutrophils Relative %: 92 % — ABNORMAL HIGH (ref 43–77)
Platelets: 214 10*3/uL (ref 150–400)
RBC: 4.02 MIL/uL (ref 3.87–5.11)
RDW: 14.4 % (ref 11.5–15.5)
WBC: 11 10*3/uL — ABNORMAL HIGH (ref 4.0–10.5)

## 2014-02-24 LAB — COMPREHENSIVE METABOLIC PANEL
ALT: 13 U/L (ref 0–35)
AST: 16 U/L (ref 0–37)
Albumin: 4.3 g/dL (ref 3.5–5.2)
Alkaline Phosphatase: 47 U/L (ref 39–117)
Anion gap: 6 (ref 5–15)
BUN: 10 mg/dL (ref 6–23)
CO2: 23 mmol/L (ref 19–32)
Calcium: 8.6 mg/dL (ref 8.4–10.5)
Chloride: 109 mEq/L (ref 96–112)
Creatinine, Ser: 0.64 mg/dL (ref 0.50–1.10)
GFR calc Af Amer: >90 mL/min (ref >90)
GFR calc non Af Amer: >90 mL/min (ref >90)
Glucose, Bld: 105 mg/dL — ABNORMAL HIGH (ref 70–99)
Potassium: 3.7 mmol/L (ref 3.5–5.1)
Sodium: 138 mmol/L (ref 135–145)
Total Bilirubin: 0.5 mg/dL (ref 0.3–1.2)
Total Protein: 7.5 g/dL (ref 6.0–8.3)

## 2014-02-24 LAB — URINE MICROSCOPIC-ADD ON

## 2014-02-24 LAB — PREGNANCY, URINE: Preg Test, Ur: NEGATIVE

## 2014-02-24 LAB — LIPASE, BLOOD: Lipase: 21 U/L (ref 11–59)

## 2014-02-24 MED ORDER — OXYCODONE-ACETAMINOPHEN 5-325 MG PO TABS
1.0000 | ORAL_TABLET | Freq: Once | ORAL | Status: AC
  Administered 2014-02-24: 1 via ORAL
  Filled 2014-02-24: qty 1

## 2014-02-24 MED ORDER — PROMETHAZINE HCL 25 MG PO TABS: 25.0000 mg | ORAL_TABLET | Freq: Four times a day (QID) | ORAL | Status: DC | PRN

## 2014-02-24 MED ORDER — PROMETHAZINE HCL 25 MG/ML IJ SOLN
25.0000 mg | Freq: Once | INTRAMUSCULAR | Status: AC
  Administered 2014-02-24: 25 mg via INTRAVENOUS
  Filled 2014-02-24: qty 1

## 2014-02-24 MED ORDER — IOHEXOL 300 MG/ML  SOLN
50.0000 mL | Freq: Once | INTRAMUSCULAR | Status: AC | PRN
  Administered 2014-02-24: 50 mL via ORAL

## 2014-02-24 MED ORDER — NITROFURANTOIN MONOHYD MACRO 100 MG PO CAPS: 100.0000 mg | ORAL_CAPSULE | Freq: Two times a day (BID) | ORAL | Status: DC

## 2014-02-24 MED ORDER — IOHEXOL 300 MG/ML  SOLN
100.0000 mL | Freq: Once | INTRAMUSCULAR | Status: AC | PRN
  Administered 2014-02-24: 100 mL via INTRAVENOUS

## 2014-02-24 NOTE — ED Provider Notes (Signed)
CSN: [id number]     Arrival date & time 02/24/14  4098 History   First MD Initiated Contact with Patient 02/24/14 [phone]     Chief Complaint  Patient presents with  . Abdominal Pain     (Consider location/radiation/quality/duration/timing/severity/associated sxs/prior Treatment) HPI Comments: Pt come in with c/o periumbilical abdominal pain that started this morning. Denies fever. Has had multiple episode of vomiting. Tried tums and omeprazol without relief. No diarrhea. Nothing has made the symptoms better or worse. Denies vaginal discharge. Previous c-section. No vaginal discharge or urinary symptoms.  The history is provided by the patient. No language interpreter was used.    History reviewed. No pertinent past medical history. History reviewed. No pertinent past surgical history. No family history on file. History  Substance Use Topics  . Smoking status: Former Games developer  . Smokeless tobacco: Not on file  . Alcohol Use: No   OB History    Gravida Para Term Preterm AB TAB SAB Ectopic Multiple Living   Review of Systems  All other systems reviewed and are negative.     Allergies  Latex  Home Medications   Prior to Admission medications   Medication Sig Start Date End Date Taking? Authorizing Provider  atomoxetine (STRATTERA) 60 MG capsule Take 60 mg by mouth daily.   Yes Historical Provider, MD   BP 141/72 mmHg  Pulse 81  Temp(Src) 97.9 F (36.6 C) (Oral)  Resp 20  Ht  (1.6 m)  Wt 160 lb (72.576 kg)  BMI 28.35 kg/m2  SpO2 100%  LMP 01/27/2014 (Approximate)  Breastfeeding? No Physical Exam  Constitutional: She is oriented to person, place, and time. She appears well-developed and well-nourished.  Cardiovascular: Normal rate and regular rhythm.   Pulmonary/Chest: Effort normal and breath sounds normal.  Abdominal:  Periumbilical tenderness  Musculoskeletal: Normal range of motion.  Neurological: She is alert and oriented to person,  place, and time. Coordination normal.  Skin: Skin is warm and dry.  Nursing note and vitals reviewed.   ED Course  Procedures (including critical care time) Labs Review Labs Reviewed  URINALYSIS, ROUTINE W REFLEX MICROSCOPIC - Abnormal; Notable for the following:    Ketones, ur 15 (*)    Nitrite POSITIVE (*)    All other components within normal limits  COMPREHENSIVE METABOLIC PANEL - Abnormal; Notable for the following:    Glucose, Bld 105 (*)    All other components within normal limits  CBC WITH DIFFERENTIAL - Abnormal; Notable for the following:    WBC 11.0 (*)    Hemoglobin 11.3 (*)    HCT 33.7 (*)    Neutrophils Relative % 92 (*)    Neutro Abs 10.0 (*)    Lymphocytes Relative 5 (*)    Lymphs Abs 0.5 (*)    All other components within normal limits  URINE MICROSCOPIC-ADD ON - Abnormal; Notable for the following:    Squamous Epithelial / LPF FEW (*)    Bacteria, UA MANY (*)    All other components within normal limits  URINE CULTURE  PREGNANCY, URINE  LIPASE, BLOOD    Imaging Review Ct Abdomen Pelvis W Contrast  02/24/2014   CLINICAL DATA:  Supraumbilical abdominal pain and vomiting, beginning this morning.  EXAM: CT ABDOMEN AND PELVIS WITH CONTRAST  TECHNIQUE: Multidetector CT imaging of the abdomen and pelvis was performed using the standard protocol following bolus administration of intravenous contrast.  CONTRAST:  50mL OMNIPAQUE IOHEXOL 300 MG/ML SOLN, 100mL OMNIPAQUE IOHEXOL 300 MG/ML SOLN  COMPARISON:  None.  FINDINGS: Lung bases are clear.  No effusions.  Heart is normal size.  Tiny hypodensity in the right hepatic lobe likely reflects a small cyst. No other focal hepatic abnormality. Gallbladder, spleen, pancreas, adrenals and kidneys are normal.  Stomach, large and small bowel are normal. Appendix is visualized and is normal. Uterus, adnexa and urinary bladder are normal. No free fluid, free air or adenopathy. Aorta is normal caliber. Small umbilical hernia  containing fat.  No acute bony abnormality or focal bone lesion.  IMPRESSION: Normal appendix.  No acute findings.  Small umbilical hernia containing fat.   Electronically Signed   By: Charlett NoseKevin  Dover M.D.   On: 02/24/2014 11:28     EKG Interpretation None      MDM   Final diagnoses:  UTI (lower urinary tract infection)  Nausea and vomiting, vomiting of unspecified type  Periumbilical abdominal pain    Pt is tolerating po here without any problem. No acute abdominal process noted. Will treat for uti as nitrite pos. Will send for cx    Teressa LowerVrinda Blythe Hartshorn, NP 02/24/14 1151  Samuel JesterKathleen McManus, DO 02/26/14 1345

## 2014-02-24 NOTE — Discharge Instructions (Signed)
Abdominal Pain, Women °Abdominal (stomach, pelvic, or belly) pain can be caused by many things. It is important to tell your doctor: °· The location of the pain. °· Does it come and go or is it present all the time? °· Are there things that start the pain (eating certain foods, exercise)? °· Are there other symptoms associated with the pain (fever, nausea, vomiting, diarrhea)? °All of this is helpful to know when trying to find the cause of the pain. °CAUSES  °· Stomach: virus or bacteria infection, or ulcer. °· Intestine: appendicitis (inflamed appendix), regional ileitis (Crohn's disease), ulcerative colitis (inflamed colon), irritable bowel syndrome, diverticulitis (inflamed diverticulum of the colon), or cancer of the stomach or intestine. °· Gallbladder disease or stones in the gallbladder. °· Kidney disease, kidney stones, or infection. °· Pancreas infection or cancer. °· Fibromyalgia (pain disorder). °· Diseases of the female organs: °· Uterus: fibroid (non-cancerous) tumors or infection. °· Fallopian tubes: infection or tubal pregnancy. °· Ovary: cysts or tumors. °· Pelvic adhesions (scar tissue). °· Endometriosis (uterus lining tissue growing in the pelvis and on the pelvic organs). °· Pelvic congestion syndrome (female organs filling up with blood just before the menstrual period). °· Pain with the menstrual period. °· Pain with ovulation (producing an egg). °· Pain with an IUD (intrauterine device, birth control) in the uterus. °· Cancer of the female organs. °· Functional pain (pain not caused by a disease, may improve without treatment). °· Psychological pain. °· Depression. °DIAGNOSIS  °Your doctor will decide the seriousness of your pain by doing an examination. °· Blood tests. °· X-rays. °· Ultrasound. °· CT scan (computed tomography, special type of X-ray). °· MRI (magnetic resonance imaging). °· Cultures, for infection. °· Barium enema (dye inserted in the large intestine, to better view it with  X-rays). °· Colonoscopy (looking in intestine with a lighted tube). °· Laparoscopy (minor surgery, looking in abdomen with a lighted tube). °· Major abdominal exploratory surgery (looking in abdomen with a large incision). °TREATMENT  °The treatment will depend on the cause of the pain.  °· Many cases can be observed and treated at home. °· Over-the-counter medicines recommended by your caregiver. °· Prescription medicine. °· Antibiotics, for infection. °· Birth control pills, for painful periods or for ovulation pain. °· Hormone treatment, for endometriosis. °· Nerve blocking injections. °· Physical therapy. °· Antidepressants. °· Counseling with a psychologist or psychiatrist. °· Minor or major surgery. °HOME CARE INSTRUCTIONS  °· Do not take laxatives, unless directed by your caregiver. °· Take over-the-counter pain medicine only if ordered by your caregiver. Do not take aspirin because it can cause an upset stomach or bleeding. °· Try a clear liquid diet (broth or water) as ordered by your caregiver. Slowly move to a bland diet, as tolerated, if the pain is related to the stomach or intestine. °· Have a thermometer and take your temperature several times a day, and record it. °· Bed rest and sleep, if it helps the pain. °· Avoid sexual intercourse, if it causes pain. °· Avoid stressful situations. °· Keep your follow-up appointments and tests, as your caregiver orders. °· If the pain does not go away with medicine or surgery, you may try: °¨ Acupuncture. °¨ Relaxation exercises (yoga, meditation). °¨ Group therapy. °¨ Counseling. °SEEK MEDICAL CARE IF:  °· You notice certain foods cause stomach pain. °· Your home care treatment is not helping your pain. °· You need stronger pain medicine. °· You want your IUD removed. °· You feel faint or   lightheaded. °· You develop nausea and vomiting. °· You develop a rash. °· You are having side effects or an allergy to your medicine. °SEEK IMMEDIATE MEDICAL CARE IF:  °· Your  pain does not go away or gets worse. °· You have a fever. °· Your pain is felt only in portions of the abdomen. The right side could possibly be appendicitis. The left lower portion of the abdomen could be colitis or diverticulitis. °· You are passing blood in your stools (bright red or black tarry stools, with or without vomiting). °· You have blood in your urine. °· You develop chills, with or without a fever. °· You pass out. °MAKE SURE YOU:  °· Understand these instructions. °· Will watch your condition. °· Will get help right away if you are not doing well or get worse. °Document Released: 11/27/2006 Document Revised: 06/16/2013 Document Reviewed: 12/17/2008 °ExitCare® Patient Information ©2015 [employer]. This information is not intended to replace advice given to you by your health care provider. Make sure you discuss any questions you have with your health care provider. ° °Urinary Tract Infection °A urinary tract infection (UTI) can occur any place along the urinary tract. The tract includes the kidneys, ureters, bladder, and urethra. A type of germ called bacteria often causes a UTI. UTIs are often helped with antibiotic medicine.  °HOME CARE  °· If given, take antibiotics as told by your doctor. Finish them even if you start to feel better. °· Drink enough fluids to keep your pee (urine) clear or pale yellow. °· Avoid tea, drinks with caffeine, and bubbly (carbonated) drinks. °· Pee often. Avoid holding your pee in for a long time. °· Pee before and after having sex (intercourse). °· Wipe from front to back after you poop (bowel movement) if you are a woman. Use each tissue only once. °GET HELP RIGHT AWAY IF:  °· You have back pain. °· You have lower belly (abdominal) pain. °· You have chills. °· You feel sick to your stomach (nauseous). °· You throw up (vomit). °· Your burning or discomfort with peeing does not go away. °· You have a fever. °· Your symptoms are not better in 3 days. °MAKE SURE YOU:    °· Understand these instructions. °· Will watch your condition. °· Will get help right away if you are not doing well or get worse. °Document Released: 07/19/2007 Document Revised: 10/25/2011 Document Reviewed: 08/31/2011 °ExitCare® Patient Information ©2015 [employer]. This information is not intended to replace advice given to you by your health care provider. Make sure you discuss any questions you have with your health care provider. ° °

## 2014-02-24 NOTE — ED Notes (Signed)
Pt stood up this morning at 0630 and had an acute onset of abd pain "right above belly button." Pt has vomited x5 since onset and has not had any relief from pain. Pt had procedure yesterday to remove toenail. Otherwise denies any other hx or symptoms. Pt reports taking tums and omeprazol this morning. A&O and in NAD.

## 2014-02-26 LAB — URINE CULTURE: Colony Count: >=100000

## 2014-02-27 ENCOUNTER — Telehealth: Payer: Self-pay | Admitting: Internal Medicine

## 2014-02-27 NOTE — Progress Notes (Signed)
ED Antimicrobial Stewardship Positive Culture Follow Up   [name] is an 29 y.o. female who [facility]/24/2014 with a chief complaint of  Chief Complaint  Patient presents with  . Abdominal Pain    Recent Results (from the past 720 hour(s))  Urine culture     Status: None   Collection Time: 02/24/14 10:10 AM  Result Value Ref Range Status   Specimen Description URINE, CLEAN CATCH  Final   Special Requests NONE  Final   Colony Count   Final    >=100,000 COLONIES/ML Performed at [facility]    Culture   Final    STAPHYLOCOCCUS AUREUS Note: RIFAMPIN AND GENTAMICIN SHOULD NOT BE USED AS SINGLE DRUGS FOR TREATMENT OF STAPH INFECTIONS. Performed at [facility]    Report Status 02/26/2014 FINAL  Final   Organism ID, Bacteria STAPHYLOCOCCUS AUREUS  Final      Susceptibility   Staphylococcus aureus - MIC*    GENTAMICIN <=0.5 SENSITIVE Sensitive     LEVOFLOXACIN 0.25 SENSITIVE Sensitive     NITROFURANTOIN <=16 SENSITIVE Sensitive     OXACILLIN <=0.25 SENSITIVE Sensitive     PENICILLIN 0.06 SENSITIVE Sensitive     RIFAMPIN <=0.5 SENSITIVE Sensitive     TRIMETH/SULFA <=10 SENSITIVE Sensitive     VANCOMYCIN 1 SENSITIVE Sensitive     TETRACYCLINE <=1 SENSITIVE Sensitive     * STAPHYLOCOCCUS AUREUS    29 yo who came in with abd pain and vomiting x5. Pt recently had a toenail procedure. She was sent out on nitrofurantoin for UTI. Her urine cx is now back with staph aureus which is normally not a typical organism. Dr. Luciana [facility]xeomer [facility] to do blood cx.    [facility] Provider: [facility]   Ulyses SouthwardMinh Pham, PharmD Pager: [phone] Infectious Diseases Pharmacist Phone# [phone]

## 2014-02-27 NOTE — Telephone Encounter (Signed)
Called patient regarding her recent [facility] ED visit and now with positive MSSA culture in urine.  Patient is continuing to improve, no fever and symptoms have not been concerning for fever or chills.  Unlikely to be true urinary infection and consistent with contaminate.     I have advised patient that if symptoms such as fever, chills or other concerns appear, she should be evaluated again in the ED and consider blood cultures.  Otherwise no treatment indicated.   Wilmoth Rasnic

## 2014-06-16 IMAGING — US US OB LIMITED
1 series · 12 of 28 positions shown · non-contrast
Comparison: none

[Series 1: us ob limited · 0.23mm/px · 12 of 52 slices shown]
[im 2/52]
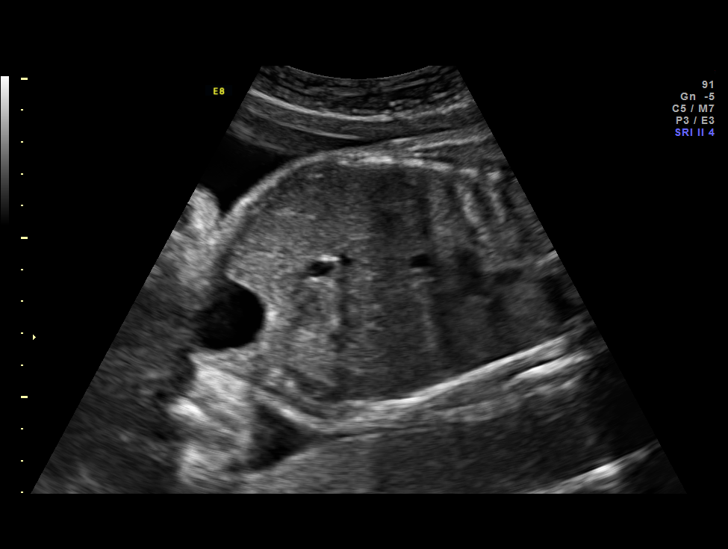
[im 6/52]
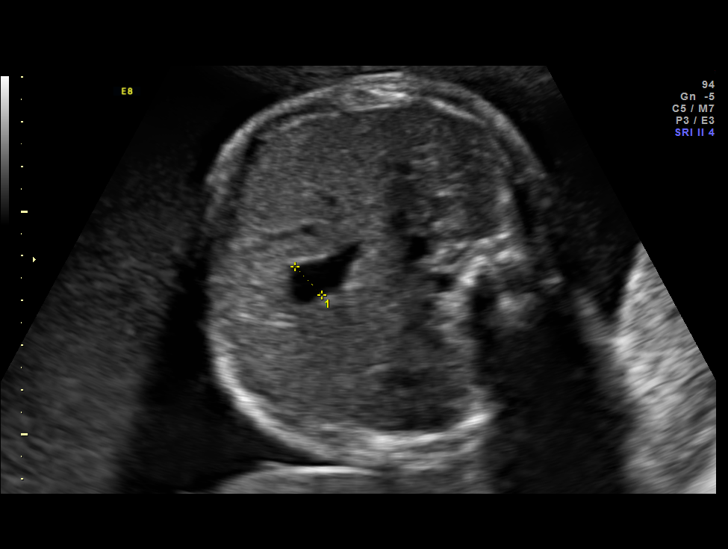
[im 10/52]
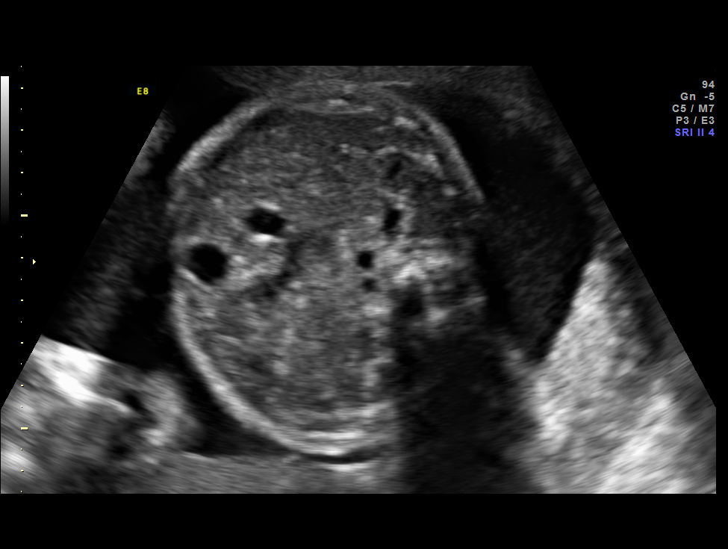
[im 16/52]
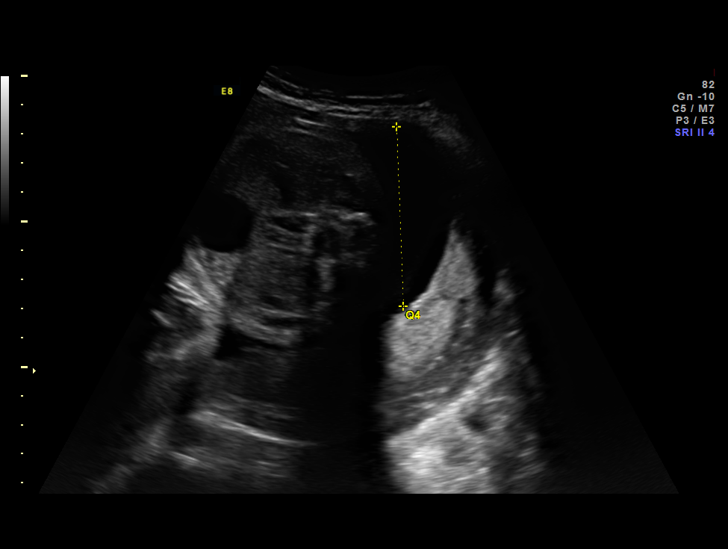
[im 19/52]
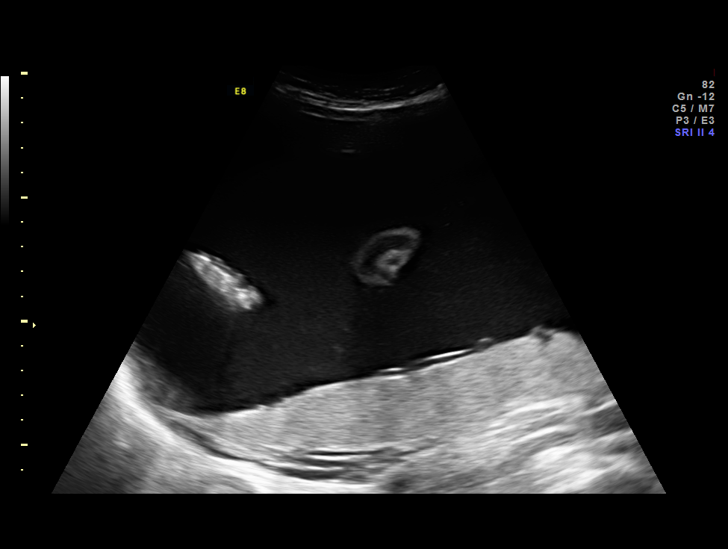
[im 23/52]
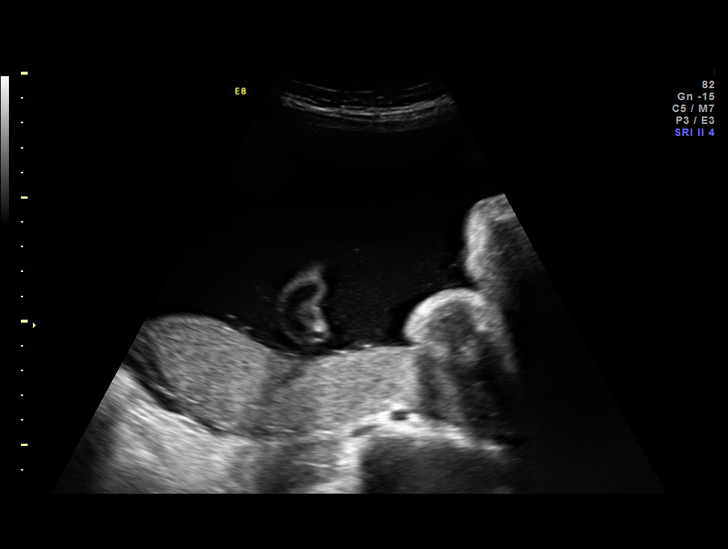
[im 29/52]
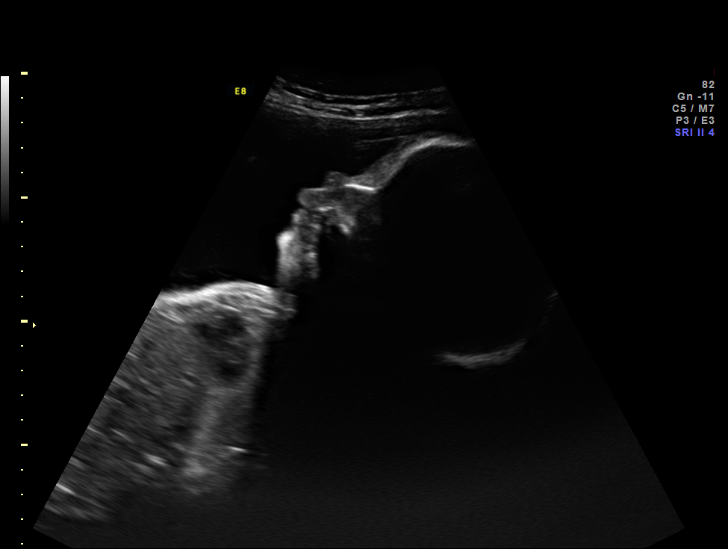
[im 33/52]
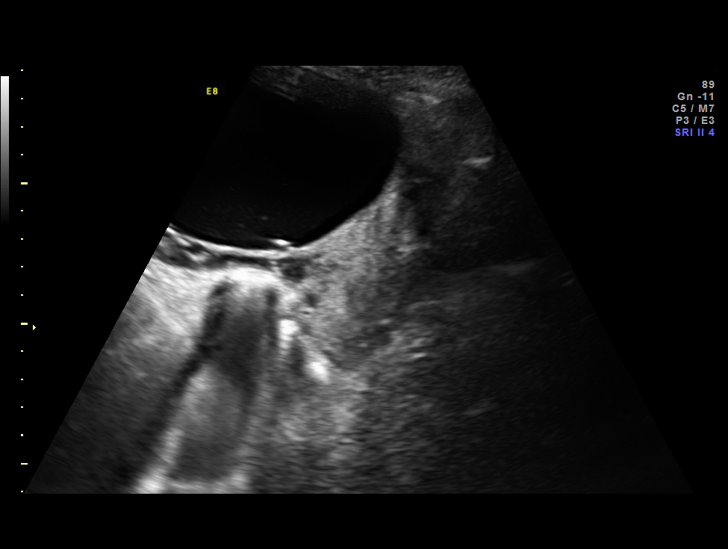
[im 36/52]
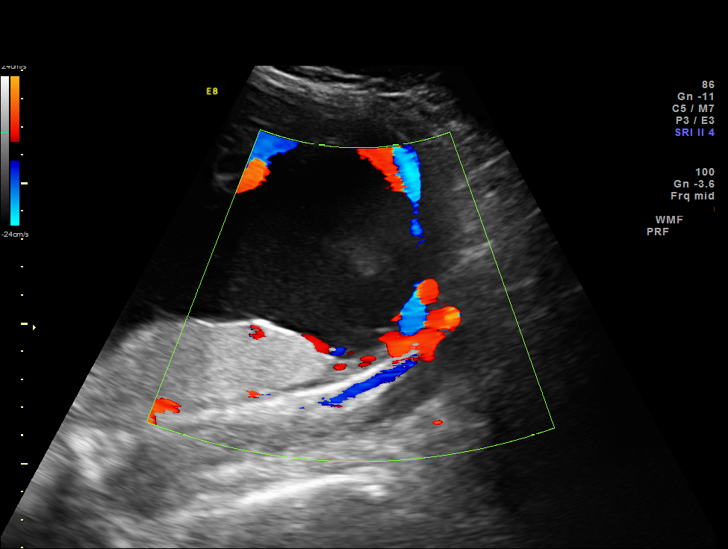
[im 42/52]
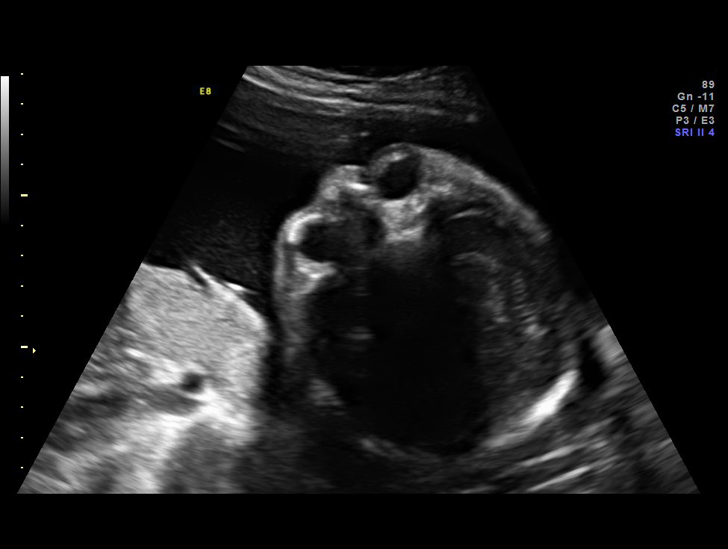
[im 46/52]
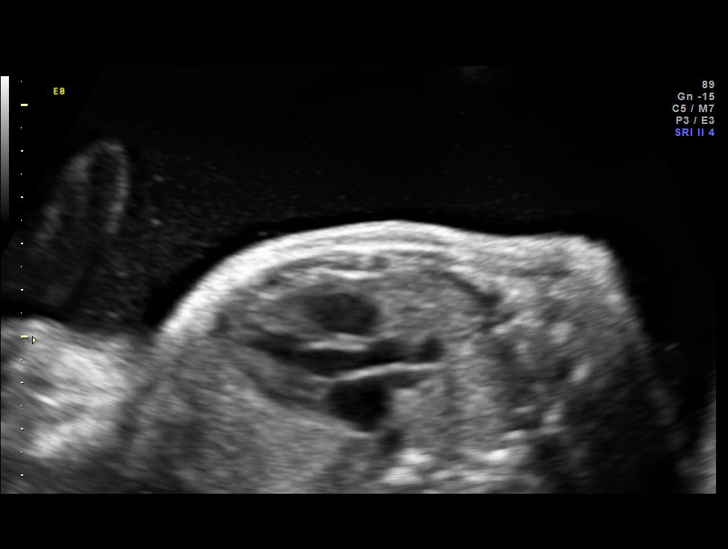
[im 50/52]
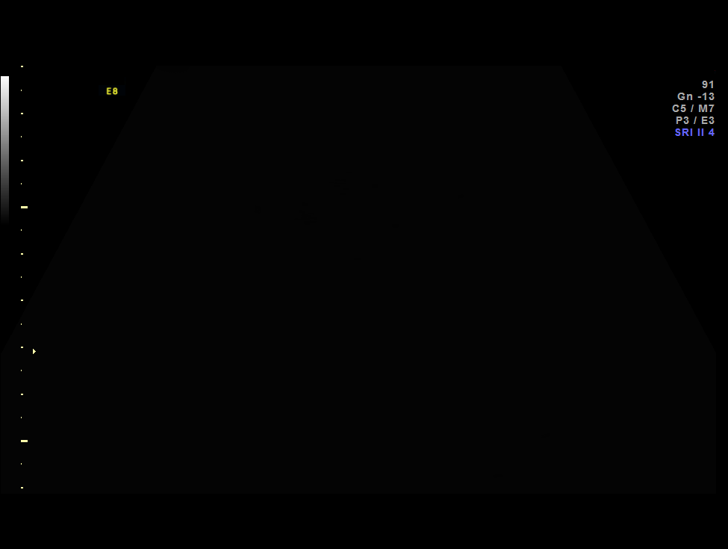

[12 of 28 positions shown; findings below may reference images not displayed]

OBSTETRICS REPORT
                      (Signed Final 02/11/2013 [DATE])

Service(s) Provided

 [HOSPITAL]                                         76815.0
Indications

 Fetal abnormality - other known or suspected
 (dilated umbilical vein)
 Placental abnormality: Low lying placental vessel
Fetal Evaluation

 Num Of Fetuses:    1
 Fetal Heart Rate:  136                          bpm
 Cardiac Activity:  Observed
 Presentation:      Cephalic
 Placenta:          Vasa Previa, posterior
                    placenta
 P. Cord            Velamentous insertion
 Insertion:

 Amniotic Fluid
 AFI FV:      Polyhydramnios
 AFI Sum:     30.65   cm     > 97  %Tile     Larg Pckt:  11.56   cm
 RUQ:   11.56   cm   RLQ:    5.94   cm    LUQ:   6.97    cm   LLQ:    6.18   cm
Biophysical Evaluation

 Amniotic F.V:   Polyhydramnios             F. Tone:        Observed
 F. Movement:    Observed                   N.S.T:          Reactive
 F. Breathing:   Not Observed               Score:          [DATE]
Gestational Age

 LMP:           31w 4d        Date:  07/05/12                 EDD:   04/11/13
 Best:          31w 4d     Det. By:  LMP  (07/05/12)          EDD:   04/11/13
Anatomy

 Cranium:          Previously seen        Aortic Arch:      Previously seen
 Fetal Cavum:      Previously seen        Ductal Arch:      Previously seen
 Ventricles:       Previously seen        Diaphragm:        Appears normal
 Choroid Plexus:   Previously seen        Stomach:          Absence of fluid
                                                            filled stomach
 Cerebellum:       Previously seen        Abdomen:          Dilated
                                                            intraabdominal
                                                            vein
 Posterior Fossa:  Previously seen        Abdominal Wall:   Appears nml (cord
                                                            insert, abd wall)
 Nuchal Fold:      Not applicable (>20    Cord Vessels:     Previously seen
                   wks GA)
 Face:             Appears normal         Kidneys:          Appear normal
                   (orbits and profile)
 Lips:             Appears normal         Bladder:          Appears normal
 Palate:           Previously seen        Spine:            Previously seen
 Heart:            Appears normal         Lower             Previously seen
                   (4CH, axis, and        Extremities:
                   situs)
 RVOT:             Previously seen        Upper             Previously seen
                                          Extremities:
 LVOT:             Appears normal

 Other:  Male gender. Heels and 5th digit previously seen.
Cervix Uterus Adnexa

 Cervical Length:    4.5      cm

 Cervix:       Normal appearance by transabdominal scan. Appears
               closed, without funnelling.
Impression

 IUP at 31+4 weeks
 1) Probable GI obstruction or fistula with secondary
 polyhdramnios; 2) UVV; 3) vasa previa and velamentous cord
 insertion
 All other interval fetal anatomy was seen and appeared
 normal, very small stomach
 BPP [DATE] (-2 for non sustained BM)
Recommendations

 Plan as of today: on [DATE] (32+5) - peds surgery consultation
 and US at [facility]; new transfer visit and admission to [facility]
 [facility] for monitoring until delivery

 questions or concerns.

## 2014-06-19 IMAGING — US US FETAL BPP W/O NONSTRESS
1 series · 12 of 12 positions shown · non-contrast
Comparison: none

[Series 1: us fetal bpp w/o nonstress · 0.23mm/px · 12 of 12 slices shown]
[im 1/12]
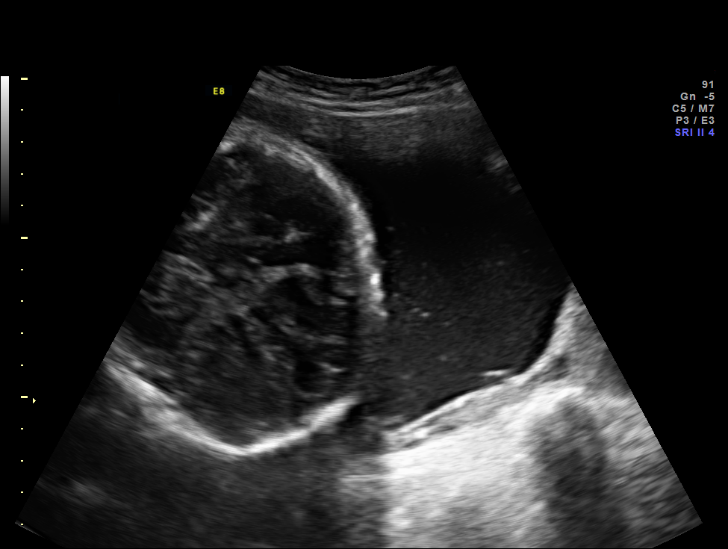
[im 2/12]
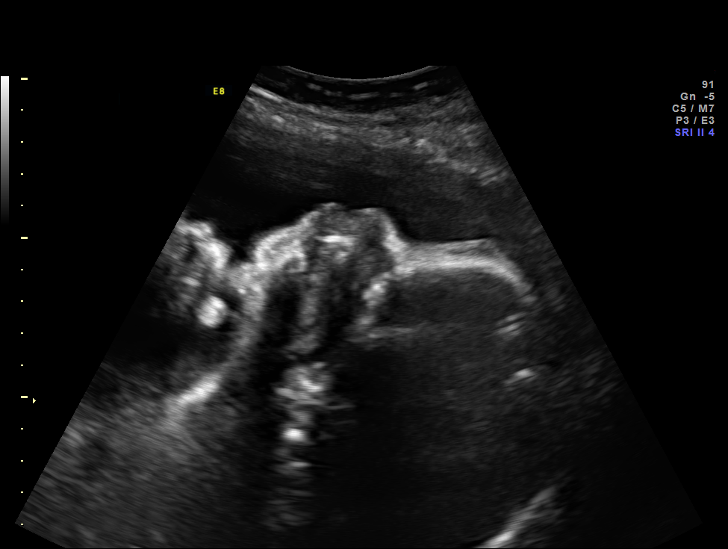
[im 3/12]
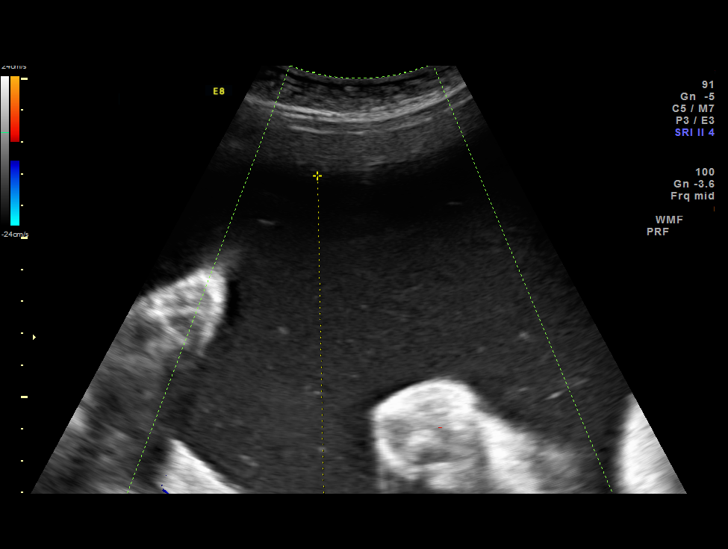
[im 4/12]
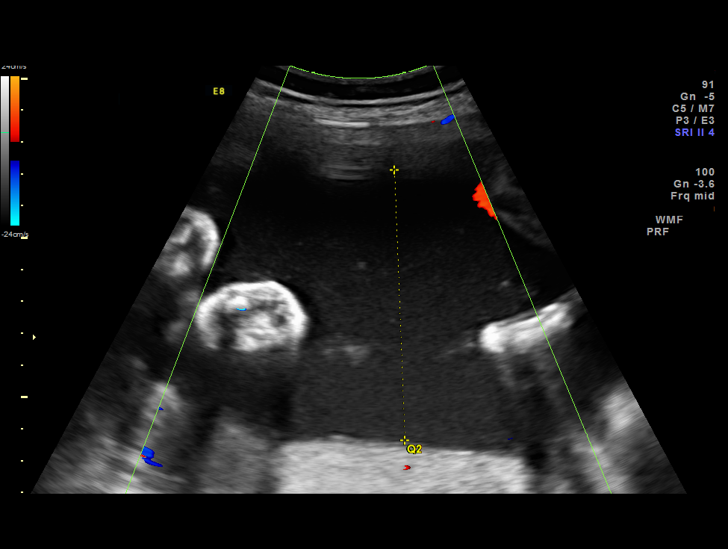
[im 5/12]
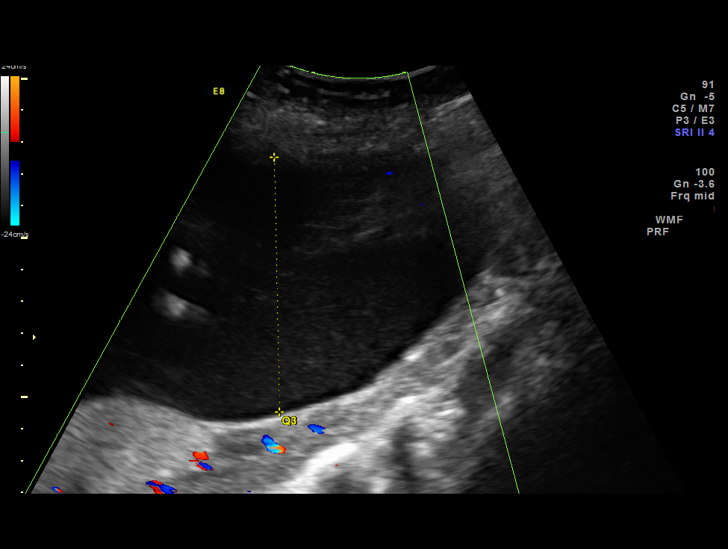
[im 6/12]
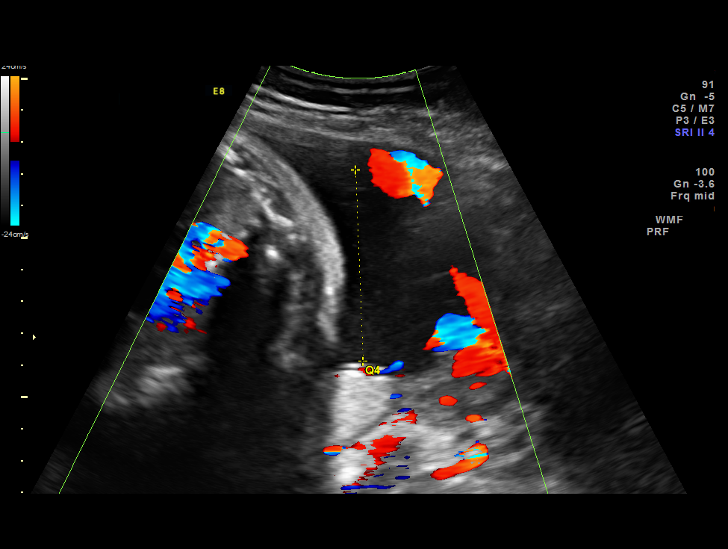
[im 7/12]
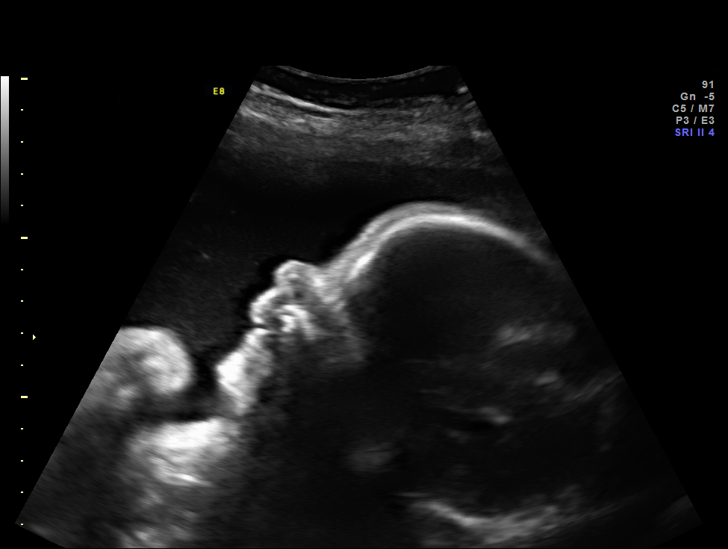
[im 8/12]
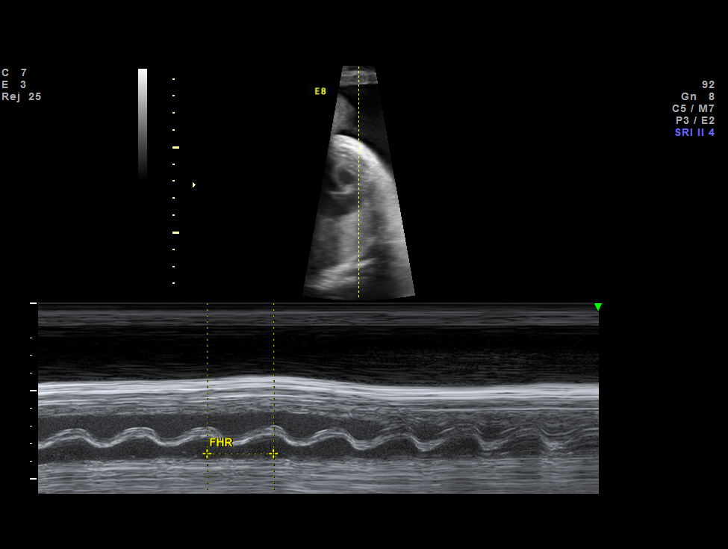
[im 9/12]
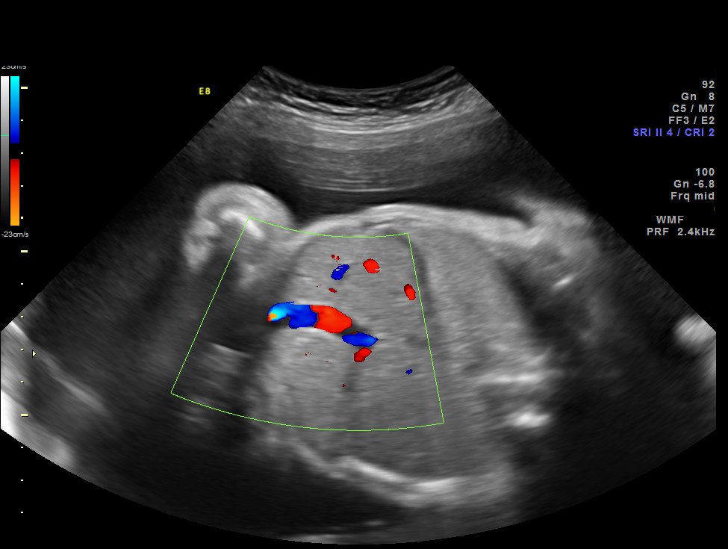
[im 10/12]
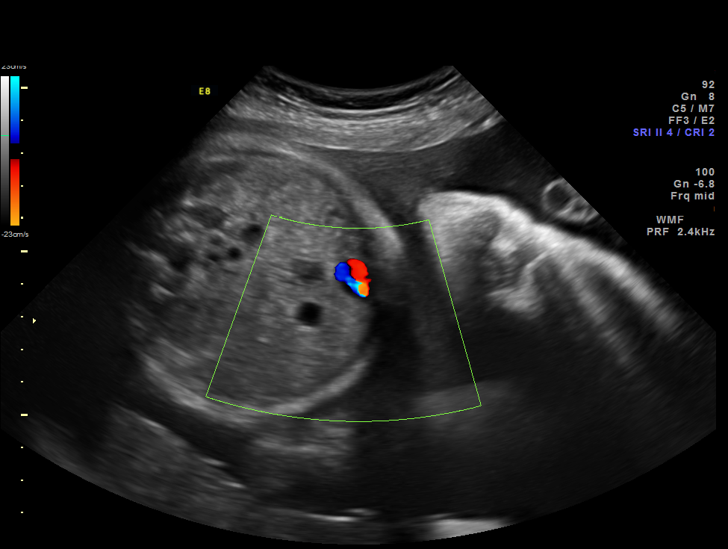
[im 11/12]
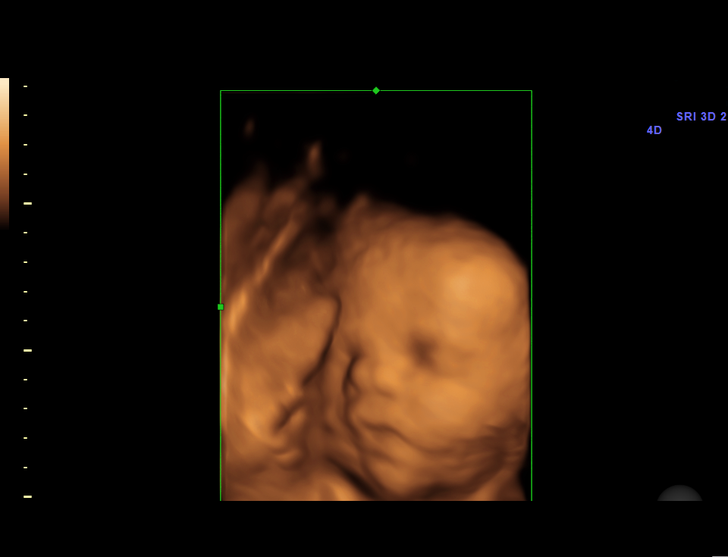
[im 12/12]
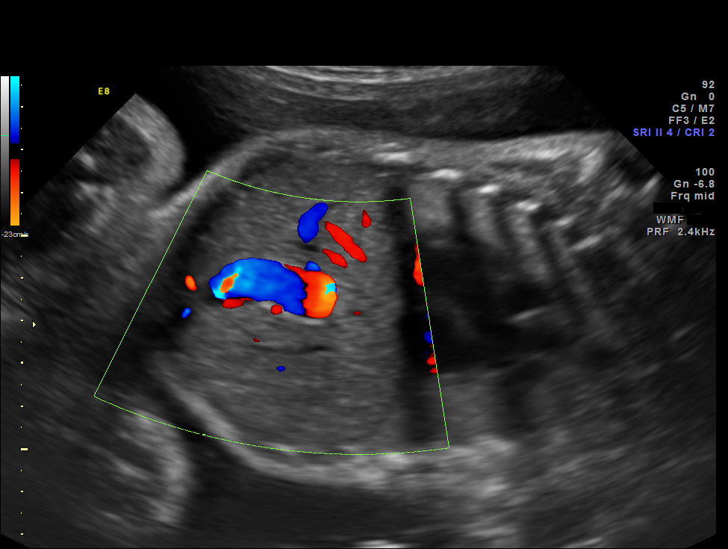

[12 of 12 positions shown; findings below may reference images not displayed]

OBSTETRICS REPORT
                      (Signed Final 02/14/2013 [DATE])

Service(s) Provided

Indications

 Fetal abnormality - other known or suspected
 (dilated umbilical vein)
 Placental abnormality: Low lying placental vessel
Fetal Evaluation

 Num Of Fetuses:    1
 Fetal Heart Rate:  152                          bpm
 Cardiac Activity:  Observed
 Presentation:      Cephalic
 Placenta:          Vasa Previa, posterior
                    placenta
 P. Cord            Velamentous insertion
 Insertion:

 Amniotic Fluid
 AFI FV:      Polyhydramnios
 AFI Sum:     32.74   cm     > 97  %Tile     Larg Pckt:  10.24   cm
 RUQ:   10.24   cm   RLQ:    6.01   cm    LUQ:   8.49    cm   LLQ:    8      cm
Biophysical Evaluation

 Amniotic F.V:   Polyhydramnios             F. Tone:        Observed
 F. Movement:    Observed                   Score:          [DATE]
 F. Breathing:   Observed
Gestational Age

 LMP:           32w 0d        Date:  07/05/12                 EDD:   04/11/13
 Best:          32w 0d     Det. By:  LMP  (07/05/12)          EDD:   04/11/13
Anatomy

 Abdomen:          Dilated
                   intraabdominal
                   vein
Cervix Uterus Adnexa

 Cervix:       Not visualized (advanced GA >22wks)
Impression

 IUP at 31+4 weeks
 1) Probable GI obstruction or fistula with secondary
 polyhdramnios; 2) UVV; 3) vasa previa and velamentous cord
 insertion
 All other interval fetal anatomy was seen and appeared
 normal, very small stomach
 BPP [DATE] (-2 for non sustained BM)
Recommendations

 1. suspicion for TE fistula versus esophageal atresia:
          -pediatric surgery consultation scheduled for 02/19/13
 with Dr. Gheys.

 2. polyhydramnios with intermittent contractions, lives 1 hour
 away from the nearest hospital, and has an apparent vasa
 previa:
          -transfer of care on 02/19/13 to [facility] ([facility]) with
 new OB nurse, [facility] visit
          -admission to [facility] at 32-33 weeks gestation (timing
 dependent upon uterine activity and concern for onset of
 spontaneous labor in context of polyhydramnios and vasa
 previa
          -administration of corticosteroids at around 33-34
 weeks gestation
          -delivery by cesarean at 34-35 weeks gestation

 3. UVV
          -continued surveillance by twice weekly ultrasounds
 for fetal well-being (BPP, hydrops scan, and power
 angiography to assess UVV)
          -increased risk for intrinsic disease given suspected
 bowel atresia vs. TE fistula:   [facility]or[facility] low risk.

 questions or concerns.

## 2014-07-21 ENCOUNTER — Inpatient Hospital Stay (HOSPITAL_COMMUNITY)
Admission: EM | Admit: 2014-07-21 | Discharge: 2014-07-23 | DRG: 923 | Disposition: A | Discharge status: Home / Self Care | Payer: Medicaid Other | Attending: Internal Medicine | Admitting: Internal Medicine

## 2014-07-21 ENCOUNTER — Emergency Department (HOSPITAL_COMMUNITY): Payer: Medicaid Other

## 2014-07-21 ENCOUNTER — Encounter (HOSPITAL_COMMUNITY): Payer: Self-pay | Admitting: Emergency Medicine

## 2014-07-21 DIAGNOSIS — T148XXA Other injury of unspecified body region, initial encounter: Secondary | ICD-10-CM

## 2014-07-21 DIAGNOSIS — N390 Urinary tract infection, site not specified: Secondary | ICD-10-CM | POA: Diagnosis present

## 2014-07-21 DIAGNOSIS — E876 Hypokalemia: Secondary | ICD-10-CM

## 2014-07-21 DIAGNOSIS — S37011A Minor contusion of right kidney, initial encounter: Secondary | ICD-10-CM | POA: Diagnosis present

## 2014-07-21 DIAGNOSIS — T7411XA Adult physical abuse, confirmed, initial encounter: Principal | ICD-10-CM | POA: Diagnosis present

## 2014-07-21 DIAGNOSIS — T1490XA Injury, unspecified, initial encounter: Secondary | ICD-10-CM

## 2014-07-21 DIAGNOSIS — R000 Tachycardia, unspecified: Secondary | ICD-10-CM | POA: Diagnosis present

## 2014-07-21 DIAGNOSIS — Z87891 Personal history of nicotine dependence: Secondary | ICD-10-CM

## 2014-07-21 DIAGNOSIS — R739 Hyperglycemia, unspecified: Secondary | ICD-10-CM

## 2014-07-21 DIAGNOSIS — Z8249 Family history of ischemic heart disease and other diseases of the circulatory system: Secondary | ICD-10-CM

## 2014-07-21 DIAGNOSIS — N12 Tubulo-interstitial nephritis, not specified as acute or chronic: Secondary | ICD-10-CM | POA: Diagnosis present

## 2014-07-21 LAB — URINALYSIS, ROUTINE W REFLEX MICROSCOPIC
Bilirubin Urine: NEGATIVE
Glucose, UA: NEGATIVE mg/dL
Nitrite: NEGATIVE
Specific Gravity, Urine: <1.005 — ABNORMAL LOW (ref 1.005–1.030)
Urobilinogen, UA: 1 mg/dL (ref 0.0–1.0)
pH: 6.5 (ref 5.0–8.0)

## 2014-07-21 LAB — COMPREHENSIVE METABOLIC PANEL
ALT: 17 U/L (ref 14–54)
AST: 18 U/L (ref 15–41)
Albumin: 3.6 g/dL (ref 3.5–5.0)
Alkaline Phosphatase: 73 U/L (ref 38–126)
Anion gap: 11 (ref 5–15)
BUN: 9 mg/dL (ref 6–20)
CO2: 26 mmol/L (ref 22–32)
Calcium: 8.4 mg/dL — ABNORMAL LOW (ref 8.9–10.3)
Chloride: 97 mmol/L — ABNORMAL LOW (ref 101–111)
Creatinine, Ser: 0.75 mg/dL (ref 0.44–1.00)
GFR calc Af Amer: >60 mL/min (ref >60)
GFR calc non Af Amer: >60 mL/min (ref >60)
Glucose, Bld: 106 mg/dL — ABNORMAL HIGH (ref 65–99)
Potassium: 3 mmol/L — ABNORMAL LOW (ref 3.5–5.1)
Sodium: 134 mmol/L — ABNORMAL LOW (ref 135–145)
Total Bilirubin: 0.9 mg/dL (ref 0.3–1.2)
Total Protein: 7.4 g/dL (ref 6.5–8.1)

## 2014-07-21 LAB — CBC WITH DIFFERENTIAL/PLATELET
Basophils Absolute: 0 10*3/uL (ref 0.0–0.1)
Basophils Relative: 0 % (ref 0–1)
Eosinophils Absolute: 0 10*3/uL (ref 0.0–0.7)
Eosinophils Relative: 0 % (ref 0–5)
HCT: 37.6 % (ref 36.0–46.0)
Hemoglobin: 12.5 g/dL (ref 12.0–15.0)
Lymphocytes Relative: 6 % — ABNORMAL LOW (ref 12–46)
Lymphs Abs: 1.1 10*3/uL (ref 0.7–4.0)
MCH: 27.8 pg (ref 26.0–34.0)
MCHC: 33.2 g/dL (ref 30.0–36.0)
MCV: 83.7 fL (ref 78.0–100.0)
Monocytes Absolute: 1.4 10*3/uL — ABNORMAL HIGH (ref 0.1–1.0)
Monocytes Relative: 8 % (ref 3–12)
Neutro Abs: 15.6 10*3/uL — ABNORMAL HIGH (ref 1.7–7.7)
Neutrophils Relative %: 86 % — ABNORMAL HIGH (ref 43–77)
Platelets: 171 10*3/uL (ref 150–400)
RBC: 4.49 MIL/uL (ref 3.87–5.11)
RDW: 15 % (ref 11.5–15.5)
WBC: 18.1 10*3/uL — ABNORMAL HIGH (ref 4.0–10.5)

## 2014-07-21 LAB — URINE MICROSCOPIC-ADD ON

## 2014-07-21 LAB — URINALYSIS, DIPSTICK ONLY
Bilirubin Urine: NEGATIVE
Glucose, UA: NEGATIVE mg/dL
Ketones, ur: 15 mg/dL — AB
Nitrite: NEGATIVE
Protein, ur: 100 mg/dL — AB
Specific Gravity, Urine: 1.02 (ref 1.005–1.030)
Urobilinogen, UA: 4 mg/dL — ABNORMAL HIGH (ref 0.0–1.0)
pH: 6 (ref 5.0–8.0)

## 2014-07-21 LAB — TROPONIN I: Troponin I: <0.03 ng/mL (ref <0.031)

## 2014-07-21 LAB — POC URINE PREG, ED: Preg Test, Ur: NEGATIVE

## 2014-07-21 MED ORDER — DEXTROSE 5 % IV SOLN
1.0000 g | Freq: Once | INTRAVENOUS | Status: AC
  Administered 2014-07-21: 1 g via INTRAVENOUS
  Filled 2014-07-21: qty 10

## 2014-07-21 MED ORDER — FLUOXETINE HCL 20 MG PO CAPS
40.0000 mg | ORAL_CAPSULE | Freq: Every day | ORAL | Status: DC
  Administered 2014-07-22 – 2014-07-23 (×2): 40 mg via ORAL
  Filled 2014-07-21 (×2): qty 2

## 2014-07-21 MED ORDER — ONDANSETRON HCL 4 MG/2ML IJ SOLN
INTRAMUSCULAR | Status: AC
  Filled 2014-07-21: qty 2

## 2014-07-21 MED ORDER — SODIUM CHLORIDE 0.9 % IV BOLUS (SEPSIS)
500.0000 mL | Freq: Once | INTRAVENOUS | Status: AC
  Administered 2014-07-21: 500 mL via INTRAVENOUS

## 2014-07-21 MED ORDER — ACETAMINOPHEN 500 MG PO TABS
ORAL_TABLET | ORAL | Status: AC
  Filled 2014-07-21: qty 2

## 2014-07-21 MED ORDER — IOHEXOL 300 MG/ML  SOLN
100.0000 mL | Freq: Once | INTRAMUSCULAR | Status: AC | PRN
  Administered 2014-07-21: 100 mL via INTRAVENOUS

## 2014-07-21 MED ORDER — ONDANSETRON HCL 4 MG/2ML IJ SOLN
4.0000 mg | Freq: Once | INTRAMUSCULAR | Status: AC
  Administered 2014-07-21: 4 mg via INTRAVENOUS

## 2014-07-21 MED ORDER — SODIUM CHLORIDE 0.9 % IV SOLN
INTRAVENOUS | Status: DC
  Administered 2014-07-21 – 2014-07-22 (×2): via INTRAVENOUS

## 2014-07-21 MED ORDER — ONDANSETRON HCL 4 MG/2ML IJ SOLN
4.0000 mg | Freq: Four times a day (QID) | INTRAMUSCULAR | Status: DC | PRN
  Administered 2014-07-22 – 2014-07-23 (×4): 4 mg via INTRAVENOUS
  Filled 2014-07-21 (×4): qty 2

## 2014-07-21 MED ORDER — HYDROMORPHONE HCL 1 MG/ML IJ SOLN
0.5000 mg | INTRAMUSCULAR | Status: DC | PRN
  Administered 2014-07-22 – 2014-07-23 (×7): 0.5 mg via INTRAVENOUS
  Filled 2014-07-21 (×7): qty 1

## 2014-07-21 MED ORDER — CEFTRIAXONE SODIUM IN DEXTROSE 20 MG/ML IV SOLN
1.0000 g | INTRAVENOUS | Status: DC
  Filled 2014-07-21: qty 50

## 2014-07-21 MED ORDER — ACETAMINOPHEN 650 MG RE SUPP: 650.0000 mg | Freq: Four times a day (QID) | RECTAL | Status: DC | PRN

## 2014-07-21 MED ORDER — ACETAMINOPHEN 325 MG PO TABS: 650.0000 mg | ORAL_TABLET | Freq: Four times a day (QID) | ORAL | Status: DC | PRN

## 2014-07-21 MED ORDER — LORAZEPAM 2 MG/ML IJ SOLN
0.5000 mg | Freq: Once | INTRAMUSCULAR | Status: AC
  Administered 2014-07-21: 0.5 mg via INTRAVENOUS
  Filled 2014-07-21: qty 1

## 2014-07-21 MED ORDER — ACETAMINOPHEN 500 MG PO TABS
1000.0000 mg | ORAL_TABLET | Freq: Once | ORAL | Status: AC
  Administered 2014-07-21: 1000 mg via ORAL

## 2014-07-21 MED ORDER — HYDROMORPHONE HCL 1 MG/ML IJ SOLN
1.0000 mg | Freq: Once | INTRAMUSCULAR | Status: AC
  Administered 2014-07-21: 1 mg via INTRAVENOUS
  Filled 2014-07-21: qty 1

## 2014-07-21 MED ORDER — LORAZEPAM 0.5 MG PO TABS
0.5000 mg | ORAL_TABLET | Freq: Every evening | ORAL | Status: DC | PRN
  Administered 2014-07-22: 0.5 mg via ORAL
  Filled 2014-07-21 (×2): qty 1

## 2014-07-21 MED ORDER — ALBUTEROL SULFATE (2.5 MG/3ML) 0.083% IN NEBU: 2.5000 mg | INHALATION_SOLUTION | Freq: Once | RESPIRATORY_TRACT | Status: DC

## 2014-07-21 MED ORDER — IPRATROPIUM-ALBUTEROL 0.5-2.5 (3) MG/3ML IN SOLN: 3.0000 mL | Freq: Once | RESPIRATORY_TRACT | Status: DC

## 2014-07-21 NOTE — ED Notes (Signed)
Patient asleep.
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
## 2014-07-22 DIAGNOSIS — E876 Hypokalemia: Secondary | ICD-10-CM | POA: Diagnosis present

## 2014-07-22 DIAGNOSIS — Z8249 Family history of ischemic heart disease and other diseases of the circulatory system: Secondary | ICD-10-CM | POA: Diagnosis not present

## 2014-07-22 DIAGNOSIS — R000 Tachycardia, unspecified: Secondary | ICD-10-CM | POA: Diagnosis present

## 2014-07-22 DIAGNOSIS — T7411XA Adult physical abuse, confirmed, initial encounter: Secondary | ICD-10-CM | POA: Diagnosis present

## 2014-07-22 DIAGNOSIS — N390 Urinary tract infection, site not specified: Secondary | ICD-10-CM | POA: Diagnosis not present

## 2014-07-22 DIAGNOSIS — S37011A Minor contusion of right kidney, initial encounter: Secondary | ICD-10-CM | POA: Diagnosis present

## 2014-07-22 DIAGNOSIS — R739 Hyperglycemia, unspecified: Secondary | ICD-10-CM | POA: Diagnosis not present

## 2014-07-22 DIAGNOSIS — R109 Unspecified abdominal pain: Secondary | ICD-10-CM | POA: Diagnosis present

## 2014-07-22 DIAGNOSIS — Z87891 Personal history of nicotine dependence: Secondary | ICD-10-CM | POA: Diagnosis not present

## 2014-07-22 DIAGNOSIS — N12 Tubulo-interstitial nephritis, not specified as acute or chronic: Secondary | ICD-10-CM | POA: Diagnosis present

## 2014-07-22 LAB — COMPREHENSIVE METABOLIC PANEL
ALT: 14 U/L (ref 14–54)
AST: 15 U/L (ref 15–41)
Albumin: 2.9 g/dL — ABNORMAL LOW (ref 3.5–5.0)
Alkaline Phosphatase: 64 U/L (ref 38–126)
Anion gap: 8 (ref 5–15)
BUN: 9 mg/dL (ref 6–20)
CO2: 24 mmol/L (ref 22–32)
Calcium: 7.7 mg/dL — ABNORMAL LOW (ref 8.9–10.3)
Chloride: 106 mmol/L (ref 101–111)
Creatinine, Ser: 0.56 mg/dL (ref 0.44–1.00)
GFR calc Af Amer: >60 mL/min (ref >60)
GFR calc non Af Amer: >60 mL/min (ref >60)
Glucose, Bld: 120 mg/dL — ABNORMAL HIGH (ref 65–99)
Potassium: 2.8 mmol/L — ABNORMAL LOW (ref 3.5–5.1)
Sodium: 138 mmol/L (ref 135–145)
Total Bilirubin: 0.6 mg/dL (ref 0.3–1.2)
Total Protein: 6.1 g/dL — ABNORMAL LOW (ref 6.5–8.1)

## 2014-07-22 LAB — CBC
HCT: 33.9 % — ABNORMAL LOW (ref 36.0–46.0)
Hemoglobin: 11.1 g/dL — ABNORMAL LOW (ref 12.0–15.0)
MCH: 27.3 pg (ref 26.0–34.0)
MCHC: 32.7 g/dL (ref 30.0–36.0)
MCV: 83.3 fL (ref 78.0–100.0)
Platelets: 143 10*3/uL — ABNORMAL LOW (ref 150–400)
RBC: 4.07 MIL/uL (ref 3.87–5.11)
RDW: 15 % (ref 11.5–15.5)
WBC: 18.2 10*3/uL — ABNORMAL HIGH (ref 4.0–10.5)

## 2014-07-22 LAB — TROPONIN I
Troponin I: <0.03 ng/mL (ref <0.031)
Troponin I: <0.03 ng/mL (ref <0.031)

## 2014-07-22 LAB — TSH: TSH: 0.869 u[IU]/mL (ref 0.350–4.500)

## 2014-07-22 MED ORDER — POTASSIUM CHLORIDE IN NACL 40-0.9 MEQ/L-% IV SOLN
INTRAVENOUS | Status: DC
  Administered 2014-07-22 – 2014-07-23 (×3): 125 mL/h via INTRAVENOUS

## 2014-07-22 MED ORDER — POTASSIUM CHLORIDE CRYS ER 20 MEQ PO TBCR
40.0000 meq | EXTENDED_RELEASE_TABLET | Freq: Every day | ORAL | Status: DC
  Administered 2014-07-22 – 2014-07-23 (×2): 40 meq via ORAL
  Filled 2014-07-22 (×2): qty 2

## 2014-07-22 MED ORDER — ZOLPIDEM TARTRATE 5 MG PO TABS
5.0000 mg | ORAL_TABLET | Freq: Once | ORAL | Status: AC
  Administered 2014-07-22: 5 mg via ORAL
  Filled 2014-07-22: qty 1

## 2014-07-22 MED ORDER — DEXTROSE 5 % IV SOLN
1.0000 g | INTRAVENOUS | Status: DC
  Administered 2014-07-22: 1 g via INTRAVENOUS
  Filled 2014-07-22 (×2): qty 10
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
## 2014-07-22 NOTE — Consult Note (Signed)
I have reviewed the patient's presenting complaint/history, as well as the CT scan images and her urinalysis.  Based on the urinalysis, it is unlikely, without significant fever, that she has pyelonephritis.  More than likely, she has a renal contusion.  As currently she is hemodynamically stable and not having significant gross or microscopic hematuria, I think observation is the best course to take at this point.  If there is drop in hemoglobin, worsening pain, or gross hematuria, I think repeating the CT scan would be worthwhile.  However, her most recent CT scan revealed no evidence of perinephric hematoma or contrast extravasation.  Feel free to call us back if there are are any further questions.
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
## 2014-07-23 ENCOUNTER — Inpatient Hospital Stay (HOSPITAL_COMMUNITY): Payer: Medicaid Other

## 2014-07-23 DIAGNOSIS — R739 Hyperglycemia, unspecified: Secondary | ICD-10-CM

## 2014-07-23 DIAGNOSIS — E876 Hypokalemia: Secondary | ICD-10-CM

## 2014-07-23 DIAGNOSIS — N12 Tubulo-interstitial nephritis, not specified as acute or chronic: Secondary | ICD-10-CM

## 2014-07-23 DIAGNOSIS — N390 Urinary tract infection, site not specified: Secondary | ICD-10-CM

## 2014-07-23 LAB — CBC
HCT: 30.7 % — ABNORMAL LOW (ref 36.0–46.0)
Hemoglobin: 10 g/dL — ABNORMAL LOW (ref 12.0–15.0)
MCH: 27.3 pg (ref 26.0–34.0)
MCHC: 32.6 g/dL (ref 30.0–36.0)
MCV: 83.9 fL (ref 78.0–100.0)
Platelets: 140 10*3/uL — ABNORMAL LOW (ref 150–400)
RBC: 3.66 MIL/uL — ABNORMAL LOW (ref 3.87–5.11)
RDW: 15 % (ref 11.5–15.5)
WBC: 10.7 10*3/uL — ABNORMAL HIGH (ref 4.0–10.5)

## 2014-07-23 LAB — BASIC METABOLIC PANEL
Anion gap: 8 (ref 5–15)
BUN: 5 mg/dL — ABNORMAL LOW (ref 6–20)
CO2: 23 mmol/L (ref 22–32)
Calcium: 7.5 mg/dL — ABNORMAL LOW (ref 8.9–10.3)
Chloride: 105 mmol/L (ref 101–111)
Creatinine, Ser: 0.59 mg/dL (ref 0.44–1.00)
GFR calc Af Amer: >60 mL/min (ref >60)
GFR calc non Af Amer: >60 mL/min (ref >60)
Glucose, Bld: 90 mg/dL (ref 65–99)
Potassium: 3.4 mmol/L — ABNORMAL LOW (ref 3.5–5.1)
Sodium: 136 mmol/L (ref 135–145)

## 2014-07-23 LAB — MAGNESIUM: Magnesium: 1.8 mg/dL (ref 1.7–2.4)

## 2014-07-23 MED ORDER — CEFUROXIME AXETIL 500 MG PO TABS: 500.0000 mg | ORAL_TABLET | Freq: Two times a day (BID) | ORAL | Status: DC

## 2014-07-23 MED ORDER — PROMETHAZINE HCL 25 MG PO TABS: 25.0000 mg | ORAL_TABLET | Freq: Four times a day (QID) | ORAL | Status: DC | PRN

## 2014-07-23 MED ORDER — OXYCODONE-ACETAMINOPHEN 5-325 MG PO TABS: 1.0000 | ORAL_TABLET | ORAL | Status: DC | PRN

## 2014-07-23 MED ORDER — OXYCODONE-ACETAMINOPHEN 5-325 MG PO TABS
1.0000 | ORAL_TABLET | ORAL | Status: DC | PRN
Start: 2014-07-23 — End: 2014-07-23
  Administered 2014-07-23: 1 via ORAL
  Filled 2014-07-23: qty 1
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
## 2014-07-23 NOTE — Progress Notes (Signed)
NURSING PROGRESS NOTE  [name] [id number] Discharge Data: 07/23/2014 4:52 PM Attending Provider: No att. providers found XIH:[id number] Molly Maduro, MD   Rosalio Loud to be D/C'd Home per MD order.    All IV's discontinued and monitored for bleeding.  All belongings returned to patient for patient to take home.  AVS summary and prescriptions reviewed with patient.   Patient left floor via wheelchair, escorted by NT.  Last Documented Vital Signs:  Blood pressure 123/76, pulse 99, temperature 100.5 F (38.1 C), temperature source Oral, resp. rate 20, height 5\' 3"  (1.6 m), weight 66.407 kg (146 lb 6.4 oz), last menstrual period 07/15/2014, SpO2 100 %.  Mertha Baars D
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
## 2014-07-24 LAB — URINE CULTURE: Colony Count: 50000

## 2014-12-13 DIAGNOSIS — S62309B Unspecified fracture of unspecified metacarpal bone, initial encounter for open fracture: Secondary | ICD-10-CM

## 2014-12-13 DIAGNOSIS — S61419A Laceration without foreign body of unspecified hand, initial encounter: Secondary | ICD-10-CM

## 2014-12-13 HISTORY — DX: Laceration without foreign body of unspecified hand, initial encounter: S61.419A

## 2014-12-13 HISTORY — DX: Unspecified fracture of unspecified metacarpal bone, initial encounter for open fracture: S62.309B

## 2014-12-15 ENCOUNTER — Other Ambulatory Visit: Payer: Self-pay | Admitting: Orthopedic Surgery

## 2014-12-15 ENCOUNTER — Encounter (HOSPITAL_BASED_OUTPATIENT_CLINIC_OR_DEPARTMENT_OTHER): Payer: Self-pay | Admitting: *Deleted

## 2014-12-17 ENCOUNTER — Ambulatory Visit (HOSPITAL_BASED_OUTPATIENT_CLINIC_OR_DEPARTMENT_OTHER): Payer: Self-pay | Admitting: Anesthesiology

## 2014-12-17 ENCOUNTER — Encounter (HOSPITAL_BASED_OUTPATIENT_CLINIC_OR_DEPARTMENT_OTHER)
Admission: RE | Disposition: A | Discharge status: Home / Self Care | Payer: Self-pay | Source: Ambulatory Visit | Attending: Orthopedic Surgery

## 2014-12-17 ENCOUNTER — Encounter (HOSPITAL_BASED_OUTPATIENT_CLINIC_OR_DEPARTMENT_OTHER): Payer: Self-pay | Admitting: *Deleted

## 2014-12-17 ENCOUNTER — Ambulatory Visit (HOSPITAL_BASED_OUTPATIENT_CLINIC_OR_DEPARTMENT_OTHER): Payer: Medicaid Other | Admitting: Anesthesiology

## 2014-12-17 ENCOUNTER — Ambulatory Visit (HOSPITAL_BASED_OUTPATIENT_CLINIC_OR_DEPARTMENT_OTHER)
Admission: RE | Admit: 2014-12-17 | Discharge: 2014-12-17 | Disposition: A | Discharge status: Home / Self Care | Payer: Self-pay | Source: Ambulatory Visit | Attending: Orthopedic Surgery | Admitting: Orthopedic Surgery

## 2014-12-17 DIAGNOSIS — F419 Anxiety disorder, unspecified: Secondary | ICD-10-CM | POA: Insufficient documentation

## 2014-12-17 DIAGNOSIS — S66324A Laceration of extensor muscle, fascia and tendon of right ring finger at wrist and hand level, initial encounter: Secondary | ICD-10-CM | POA: Insufficient documentation

## 2014-12-17 DIAGNOSIS — S61411A Laceration without foreign body of right hand, initial encounter: Secondary | ICD-10-CM | POA: Insufficient documentation

## 2014-12-17 DIAGNOSIS — Y9389 Activity, other specified: Secondary | ICD-10-CM | POA: Insufficient documentation

## 2014-12-17 DIAGNOSIS — Y9289 Other specified places as the place of occurrence of the external cause: Secondary | ICD-10-CM | POA: Insufficient documentation

## 2014-12-17 DIAGNOSIS — S66326A Laceration of extensor muscle, fascia and tendon of right little finger at wrist and hand level, initial encounter: Secondary | ICD-10-CM | POA: Insufficient documentation

## 2014-12-17 DIAGNOSIS — Z91040 Latex allergy status: Secondary | ICD-10-CM | POA: Insufficient documentation

## 2014-12-17 DIAGNOSIS — X780XXA Intentional self-harm by sharp glass, initial encounter: Secondary | ICD-10-CM | POA: Insufficient documentation

## 2014-12-17 DIAGNOSIS — S62396B Other fracture of fifth metacarpal bone, right hand, initial encounter for open fracture: Secondary | ICD-10-CM | POA: Insufficient documentation

## 2014-12-17 DIAGNOSIS — J45909 Unspecified asthma, uncomplicated: Secondary | ICD-10-CM | POA: Insufficient documentation

## 2014-12-17 DIAGNOSIS — Y998 Other external cause status: Secondary | ICD-10-CM | POA: Insufficient documentation

## 2014-12-17 DIAGNOSIS — Z888 Allergy status to other drugs, medicaments and biological substances status: Secondary | ICD-10-CM | POA: Insufficient documentation

## 2014-12-17 DIAGNOSIS — Z87891 Personal history of nicotine dependence: Secondary | ICD-10-CM | POA: Insufficient documentation

## 2014-12-17 DIAGNOSIS — W228XXA Striking against or struck by other objects, initial encounter: Secondary | ICD-10-CM | POA: Insufficient documentation

## 2014-12-17 HISTORY — DX: Unspecified asthma, uncomplicated: J45.909

## 2014-12-17 HISTORY — DX: Anxiety disorder, unspecified: F41.9

## 2014-12-17 HISTORY — DX: Laceration without foreign body of unspecified hand, initial encounter: S61.419A

## 2014-12-17 HISTORY — DX: Unspecified fracture of unspecified metacarpal bone, initial encounter for open fracture: S62.309B

## 2014-12-17 HISTORY — DX: Laceration of unspecified muscle, fascia and tendon at wrist and hand level, unspecified hand, initial encounter: S66.929A

## 2014-12-17 HISTORY — PX: REPAIR EXTENSOR TENDON: SHX5382

## 2014-12-17 HISTORY — PX: I & D EXTREMITY: SHX5045

## 2014-12-17 SURGERY — IRRIGATION AND DEBRIDEMENT EXTREMITY
Anesthesia: General | Site: Hand | Laterality: Right

## 2014-12-17 MED ORDER — PROPOFOL 10 MG/ML IV BOLUS
INTRAVENOUS | Status: DC | PRN
  Administered 2014-12-17: 200 mg via INTRAVENOUS

## 2014-12-17 MED ORDER — ONDANSETRON HCL 4 MG/2ML IJ SOLN
INTRAMUSCULAR | Status: AC
  Filled 2014-12-17: qty 4

## 2014-12-17 MED ORDER — SCOPOLAMINE 1 MG/3DAYS TD PT72
MEDICATED_PATCH | TRANSDERMAL | Status: AC
  Filled 2014-12-17: qty 1

## 2014-12-17 MED ORDER — OXYCODONE HCL 5 MG/5ML PO SOLN: 5.0000 mg | Freq: Once | ORAL | Status: AC | PRN

## 2014-12-17 MED ORDER — OXYCODONE-ACETAMINOPHEN 5-325 MG PO TABS: ORAL_TABLET | ORAL | Status: DC

## 2014-12-17 MED ORDER — CHLORHEXIDINE GLUCONATE 4 % EX LIQD: 60.0000 mL | Freq: Once | CUTANEOUS | Status: DC

## 2014-12-17 MED ORDER — HYDROMORPHONE HCL 1 MG/ML IJ SOLN
0.2500 mg | Freq: Once | INTRAMUSCULAR | Status: DC
Start: 2014-12-17 — End: 2014-12-17

## 2014-12-17 MED ORDER — PROMETHAZINE HCL 12.5 MG PO TABS: 12.5000 mg | ORAL_TABLET | Freq: Four times a day (QID) | ORAL | Status: DC | PRN

## 2014-12-17 MED ORDER — HYDROMORPHONE HCL 1 MG/ML IJ SOLN
INTRAMUSCULAR | Status: AC
  Filled 2014-12-17: qty 1

## 2014-12-17 MED ORDER — DEXAMETHASONE SODIUM PHOSPHATE 10 MG/ML IJ SOLN
INTRAMUSCULAR | Status: AC
  Filled 2014-12-17: qty 1

## 2014-12-17 MED ORDER — OXYCODONE HCL 5 MG PO TABS
5.0000 mg | ORAL_TABLET | Freq: Once | ORAL | Status: AC | PRN
  Administered 2014-12-17: 5 mg via ORAL

## 2014-12-17 MED ORDER — LIDOCAINE HCL (CARDIAC) 20 MG/ML IV SOLN
INTRAVENOUS | Status: DC | PRN
  Administered 2014-12-17: 80 mg via INTRAVENOUS

## 2014-12-17 MED ORDER — BUPIVACAINE HCL (PF) 0.25 % IJ SOLN
INTRAMUSCULAR | Status: DC | PRN
  Administered 2014-12-17: 10 mL

## 2014-12-17 MED ORDER — EPHEDRINE SULFATE 50 MG/ML IJ SOLN
INTRAMUSCULAR | Status: DC | PRN
  Administered 2014-12-17: 10 mg via INTRAVENOUS

## 2014-12-17 MED ORDER — SCOPOLAMINE 1 MG/3DAYS TD PT72
1.0000 | MEDICATED_PATCH | Freq: Once | TRANSDERMAL | Status: DC | PRN
  Administered 2014-12-17: 1.5 mg via TRANSDERMAL

## 2014-12-17 MED ORDER — FENTANYL CITRATE (PF) 100 MCG/2ML IJ SOLN
INTRAMUSCULAR | Status: AC
  Filled 2014-12-17: qty 4

## 2014-12-17 MED ORDER — LIDOCAINE HCL (CARDIAC) 20 MG/ML IV SOLN
INTRAVENOUS | Status: AC
  Filled 2014-12-17: qty 5

## 2014-12-17 MED ORDER — ONDANSETRON HCL 4 MG/2ML IJ SOLN
INTRAMUSCULAR | Status: DC | PRN
  Administered 2014-12-17: 4 mg via INTRAVENOUS

## 2014-12-17 MED ORDER — HYDROMORPHONE HCL 1 MG/ML IJ SOLN
0.2500 mg | INTRAMUSCULAR | Status: DC | PRN
  Administered 2014-12-17: 0.5 mg via INTRAVENOUS

## 2014-12-17 MED ORDER — KETOROLAC TROMETHAMINE 30 MG/ML IJ SOLN
30.0000 mg | Freq: Once | INTRAMUSCULAR | Status: AC
  Administered 2014-12-17: 30 mg via INTRAVENOUS

## 2014-12-17 MED ORDER — HYDROMORPHONE HCL 1 MG/ML IJ SOLN
0.2500 mg | INTRAMUSCULAR | Status: DC | PRN
  Administered 2014-12-17 (×4): 0.5 mg via INTRAVENOUS

## 2014-12-17 MED ORDER — FENTANYL CITRATE (PF) 100 MCG/2ML IJ SOLN
50.0000 ug | INTRAMUSCULAR | Status: AC | PRN
  Administered 2014-12-17: 50 ug via INTRAVENOUS
  Administered 2014-12-17: 100 ug via INTRAVENOUS
  Administered 2014-12-17: 50 ug via INTRAVENOUS

## 2014-12-17 MED ORDER — CEFAZOLIN SODIUM-DEXTROSE 2-3 GM-% IV SOLR
2.0000 g | INTRAVENOUS | Status: AC
  Administered 2014-12-17: 2 g via INTRAVENOUS

## 2014-12-17 MED ORDER — DEXAMETHASONE SODIUM PHOSPHATE 10 MG/ML IJ SOLN
INTRAMUSCULAR | Status: DC | PRN
  Administered 2014-12-17: 10 mg via INTRAVENOUS

## 2014-12-17 MED ORDER — KETOROLAC TROMETHAMINE 30 MG/ML IJ SOLN
INTRAMUSCULAR | Status: AC
  Filled 2014-12-17: qty 1

## 2014-12-17 MED ORDER — MIDAZOLAM HCL 2 MG/2ML IJ SOLN
1.0000 mg | INTRAMUSCULAR | Status: DC | PRN
  Administered 2014-12-17: 2 mg via INTRAVENOUS

## 2014-12-17 MED ORDER — MEPERIDINE HCL 25 MG/ML IJ SOLN: 6.2500 mg | INTRAMUSCULAR | Status: DC | PRN

## 2014-12-17 MED ORDER — LACTATED RINGERS IV SOLN
INTRAVENOUS | Status: DC
  Administered 2014-12-17: 11:00:00 via INTRAVENOUS
  Administered 2014-12-17: 10 mL/h via INTRAVENOUS
  Administered 2014-12-17: 09:00:00 via INTRAVENOUS

## 2014-12-17 MED ORDER — GLYCOPYRROLATE 0.2 MG/ML IJ SOLN: 0.2000 mg | Freq: Once | INTRAMUSCULAR | Status: DC | PRN

## 2014-12-17 MED ORDER — FENTANYL CITRATE (PF) 100 MCG/2ML IJ SOLN
INTRAMUSCULAR | Status: AC
  Filled 2014-12-17: qty 2

## 2014-12-17 MED ORDER — CEFAZOLIN SODIUM-DEXTROSE 2-3 GM-% IV SOLR
INTRAVENOUS | Status: AC
  Filled 2014-12-17: qty 50

## 2014-12-17 MED ORDER — MIDAZOLAM HCL 2 MG/2ML IJ SOLN
INTRAMUSCULAR | Status: AC
  Filled 2014-12-17: qty 4

## 2014-12-17 MED ORDER — SULFAMETHOXAZOLE-TRIMETHOPRIM 800-160 MG PO TABS: 1.0000 | ORAL_TABLET | Freq: Two times a day (BID) | ORAL | Status: DC

## 2014-12-17 MED ORDER — MIDAZOLAM HCL 2 MG/2ML IJ SOLN
INTRAMUSCULAR | Status: AC
  Filled 2014-12-17: qty 2

## 2014-12-17 MED ORDER — OXYCODONE HCL 5 MG PO TABS
ORAL_TABLET | ORAL | Status: AC
  Filled 2014-12-17: qty 1

## 2014-12-17 SURGICAL SUPPLY — 98 items
APL SKNCLS STERI-STRIP NONHPOA (GAUZE/BANDAGES/DRESSINGS)
BAG DECANTER FOR FLEXI CONT (MISCELLANEOUS) IMPLANT
BALL CTTN LRG ABS STRL LF (GAUZE/BANDAGES/DRESSINGS)
BANDAGE ELASTIC 3 VELCRO ST LF (GAUZE/BANDAGES/DRESSINGS) ×3 IMPLANT
BENZOIN TINCTURE PRP APPL 2/3 (GAUZE/BANDAGES/DRESSINGS) IMPLANT
BLADE MINI RND TIP GREEN BEAV (BLADE) IMPLANT
BLADE SURG 15 STRL LF DISP TIS (BLADE) ×2 IMPLANT
BLADE SURG 15 STRL SS (BLADE) ×6
BNDG CMPR 9X4 STRL LF SNTH (GAUZE/BANDAGES/DRESSINGS) ×1
BNDG CMPR MD 5X2 ELC HKLP STRL (GAUZE/BANDAGES/DRESSINGS)
BNDG COHESIVE 1X5 TAN STRL LF (GAUZE/BANDAGES/DRESSINGS) IMPLANT
BNDG CONFORM 2 STRL LF (GAUZE/BANDAGES/DRESSINGS) IMPLANT
BNDG ELASTIC 2 VLCR STRL LF (GAUZE/BANDAGES/DRESSINGS) IMPLANT
BNDG ESMARK 4X9 LF (GAUZE/BANDAGES/DRESSINGS) ×3 IMPLANT
BNDG GAUZE 1X2.1 STRL (MISCELLANEOUS) IMPLANT
BNDG GAUZE ELAST 4 BULKY (GAUZE/BANDAGES/DRESSINGS) ×2 IMPLANT
BRUSH SCRUB EZ PLAIN DRY (MISCELLANEOUS) ×3 IMPLANT
CHLORAPREP W/TINT 26ML (MISCELLANEOUS) ×1 IMPLANT
CLOSURE WOUND 1/2 X4 (GAUZE/BANDAGES/DRESSINGS)
CORDS BIPOLAR (ELECTRODE) ×3 IMPLANT
COTTONBALL LRG STERILE PKG (GAUZE/BANDAGES/DRESSINGS) IMPLANT
COVER BACK TABLE 60X90IN (DRAPES) ×3 IMPLANT
COVER MAYO STAND STRL (DRAPES) ×3 IMPLANT
CUFF TOURNIQUET SINGLE 18IN (TOURNIQUET CUFF) ×3 IMPLANT
DECANTER SPIKE VIAL GLASS SM (MISCELLANEOUS) IMPLANT
DRAIN TLS ROUND 10FR (DRAIN) IMPLANT
DRAPE EXTREMITY T 121X128X90 (DRAPE) ×3 IMPLANT
DRAPE OEC MINIVIEW 54X84 (DRAPES) IMPLANT
DRAPE SURG 17X23 STRL (DRAPES) ×3 IMPLANT
DRSG PAD ABDOMINAL 8X10 ST (GAUZE/BANDAGES/DRESSINGS) IMPLANT
GAUZE PACKING IODOFORM 1/4X15 (GAUZE/BANDAGES/DRESSINGS) IMPLANT
GAUZE SPONGE 4X4 12PLY STRL (GAUZE/BANDAGES/DRESSINGS) ×3 IMPLANT
GAUZE SPONGE 4X4 16PLY XRAY LF (GAUZE/BANDAGES/DRESSINGS) IMPLANT
GAUZE XEROFORM 1X8 LF (GAUZE/BANDAGES/DRESSINGS) ×3 IMPLANT
GLOVE BIO SURGEON STRL SZ7.5 (GLOVE) ×1 IMPLANT
GLOVE BIOGEL PI IND STRL 7.0 (GLOVE) IMPLANT
GLOVE BIOGEL PI IND STRL 8 (GLOVE) ×1 IMPLANT
GLOVE BIOGEL PI INDICATOR 7.0 (GLOVE) ×4
GLOVE BIOGEL PI INDICATOR 8 (GLOVE) ×2
GLOVE SURG SS PI 7.5 STRL IVOR (GLOVE) ×2 IMPLANT
GOWN STRL REUS W/ TWL LRG LVL3 (GOWN DISPOSABLE) ×1 IMPLANT
GOWN STRL REUS W/TWL LRG LVL3 (GOWN DISPOSABLE) ×3
GOWN STRL REUS W/TWL XL LVL3 (GOWN DISPOSABLE) ×3 IMPLANT
K-WIRE .035X4 (WIRE) IMPLANT
LOOP VESSEL MAXI BLUE (MISCELLANEOUS) ×2 IMPLANT
NDL HYPO 25X1 1.5 SAFETY (NEEDLE) IMPLANT
NDL KEITH (NEEDLE) IMPLANT
NEEDLE HYPO 22GX1.5 SAFETY (NEEDLE) IMPLANT
NEEDLE HYPO 25X1 1.5 SAFETY (NEEDLE) ×3 IMPLANT
NEEDLE KEITH (NEEDLE) IMPLANT
NS IRRIG 1000ML POUR BTL (IV SOLUTION) ×3 IMPLANT
PACK BASIN DAY SURGERY FS (CUSTOM PROCEDURE TRAY) ×3 IMPLANT
PAD CAST 3X4 CTTN HI CHSV (CAST SUPPLIES) ×1 IMPLANT
PAD CAST 4YDX4 CTTN HI CHSV (CAST SUPPLIES) IMPLANT
PADDING CAST ABS 3INX4YD NS (CAST SUPPLIES) ×2
PADDING CAST ABS 4INX4YD NS (CAST SUPPLIES) ×2
PADDING CAST ABS COTTON 3X4 (CAST SUPPLIES) IMPLANT
PADDING CAST ABS COTTON 4X4 ST (CAST SUPPLIES) ×1 IMPLANT
PADDING CAST COTTON 3X4 STRL (CAST SUPPLIES) ×3
PADDING CAST COTTON 4X4 STRL (CAST SUPPLIES) ×3
SLEEVE SCD COMPRESS KNEE MED (MISCELLANEOUS) IMPLANT
SPLINT PLASTER CAST XFAST 3X15 (CAST SUPPLIES) IMPLANT
SPLINT PLASTER CAST XFAST 4X15 (CAST SUPPLIES) IMPLANT
SPLINT PLASTER XTRA FAST SET 4 (CAST SUPPLIES)
SPLINT PLASTER XTRA FASTSET 3X (CAST SUPPLIES) ×20
STOCKINETTE 4X48 STRL (DRAPES) ×3 IMPLANT
STRIP CLOSURE SKIN 1/2X4 (GAUZE/BANDAGES/DRESSINGS) IMPLANT
SUT CHROMIC 5 0 P 3 (SUTURE) ×2 IMPLANT
SUT ETHIBOND 3-0 V-5 (SUTURE) IMPLANT
SUT ETHILON 3 0 PS 1 (SUTURE) IMPLANT
SUT ETHILON 4 0 PS 2 18 (SUTURE) ×4 IMPLANT
SUT FIBERWIRE 3-0 18 TAPR NDL (SUTURE)
SUT FIBERWIRE 4-0 18 DIAM BLUE (SUTURE)
SUT MERSILENE 2.0 SH NDLE (SUTURE) IMPLANT
SUT MERSILENE 3 0 FS 1 (SUTURE) IMPLANT
SUT MERSILENE 4 0 P 3 (SUTURE) IMPLANT
SUT MNCRL AB 4-0 PS2 18 (SUTURE) IMPLANT
SUT POLY BUTTON 15MM (SUTURE) IMPLANT
SUT PROLENE 2 0 SH DA (SUTURE) IMPLANT
SUT PROLENE 6 0 P 1 18 (SUTURE) IMPLANT
SUT SILK 2 0 FS (SUTURE) ×2 IMPLANT
SUT SILK 4 0 PS 2 (SUTURE) IMPLANT
SUT STEEL 4 0 V 26 (SUTURE) IMPLANT
SUT VIC AB 3-0 PS1 18 (SUTURE)
SUT VIC AB 3-0 PS1 18XBRD (SUTURE) IMPLANT
SUT VIC AB 4-0 P-3 18XBRD (SUTURE) IMPLANT
SUT VIC AB 4-0 P3 18 (SUTURE)
SUT VICRYL 4-0 PS2 18IN ABS (SUTURE) IMPLANT
SUTURE FIBERWR 3-0 18 TAPR NDL (SUTURE) IMPLANT
SUTURE FIBERWR 4-0 18 DIA BLUE (SUTURE) IMPLANT
SWAB COLLECTION DEVICE MRSA (MISCELLANEOUS) IMPLANT
SWAB CULTURE ESWAB REG 1ML (MISCELLANEOUS) IMPLANT
SYR BULB 3OZ (MISCELLANEOUS) ×3 IMPLANT
SYR CONTROL 10ML LL (SYRINGE) IMPLANT
TOWEL OR 17X24 6PK STRL BLUE (TOWEL DISPOSABLE) ×6 IMPLANT
TRAY DSU PREP LF (CUSTOM PROCEDURE TRAY) ×3 IMPLANT
TUBE FEEDING 5FR 15 INCH (TUBING) IMPLANT
UNDERPAD 30X30 (UNDERPADS AND DIAPERS) ×3 IMPLANT

## 2014-12-17 NOTE — Anesthesia Preprocedure Evaluation (Signed)
Anesthesia Evaluation  Patient identified by MRN, date of birth, ID band Patient awake    Reviewed: Allergy & Precautions, NPO status , Patient's Chart, lab work & pertinent test results  Airway Mallampati: I  TM Distance: >3 FB Neck ROM: Full    Dental  (+) Teeth Intact, Dental Advisory Given   Pulmonary former smoker,  breath sounds clear to auscultation        Cardiovascular Rhythm:Regular Rate:Normal     Neuro/Psych    GI/Hepatic   Endo/Other    Renal/GU      Musculoskeletal   Abdominal   Peds  Hematology   Anesthesia Other Findings   Reproductive/Obstetrics                            Anesthesia Physical Anesthesia Plan  ASA: II  Anesthesia Plan: General   Post-op Pain Management:    Induction: Intravenous  Airway Management Planned: LMA  Additional Equipment:   Intra-op Plan:   Post-operative Plan: Extubation in OR  Informed Consent: I have reviewed the patients History and Physical, chart, labs and discussed the procedure including the risks, benefits and alternatives for the proposed anesthesia with the patient or authorized representative who has indicated his/her understanding and acceptance.   Dental advisory given  Plan Discussed with: CRNA, Anesthesiologist and Surgeon  Anesthesia Plan Comments:         Anesthesia Quick Evaluation  

## 2014-12-17 NOTE — Discharge Instructions (Addendum)

## 2014-12-17 NOTE — Transfer of Care (Signed)
Immediate Anesthesia Transfer of Care Note  Patient: [name]  Procedure(s) Performed: Procedure(s): RIGHT HAND IRRIGATION AND DEBRIDEMENT, (Right) REPAIR EXTENSOR TENDON (Right)  Patient Location: PACU  Anesthesia Type:General  Level of Consciousness: sedated  Airway & Oxygen Therapy: Patient Spontanous Breathing and Patient connected to face mask oxygen  Post-op Assessment: Report given to RN and Post -op Vital signs reviewed and stable  Post vital signs: Reviewed and stable  Last Vitals:  Filed Vitals:   12/17/14 0816  BP: 110/72  Pulse: 115  Temp: 36.9 C  Resp: 20    Complications: No apparent anesthesia complications

## 2014-12-17 NOTE — Anesthesia Procedure Notes (Signed)
Procedure Name: LMA Insertion Date/Time: 12/17/2014 9:38 AM Performed by: Burna CashONRAD, Amair Shrout C Pre-anesthesia Checklist: Patient identified, Emergency Drugs available, Suction available and Patient being monitored Patient Re-evaluated:Patient Re-evaluated prior to inductionOxygen Delivery Method: Circle System Utilized Preoxygenation: Pre-oxygenation with 100% oxygen Intubation Type: IV induction Ventilation: Mask ventilation without difficulty LMA: LMA inserted LMA Size: 4.0 Number of attempts: 1 Airway Equipment and Method: Bite block Placement Confirmation: positive ETCO2 Tube secured with: Tape Dental Injury: Teeth and Oropharynx as per pre-operative assessment

## 2014-12-17 NOTE — Brief Op Note (Signed)
12/17/2014  10:43 AM  PATIENT:  [name]  29 y.o. female  PRE-OPERATIVE DIAGNOSIS:  RIGHT HAND LACERATION WITH TENDON LACERATION, OPEN METACARPAL FRACTURE   POST-OPERATIVE DIAGNOSIS:  RIGHT HAND LACERATION WITH TENDON LACERATION  PROCEDURE:  Procedure(s): RIGHT HAND IRRIGATION AND DEBRIDEMENT, (Right) REPAIR EXTENSOR TENDON (Right)  SURGEON:  Surgeon(s) and Role:    * Betha LoaKevin Arieonna Medine, MD - Primary  PHYSICIAN ASSISTANT:   ASSISTANTS: none   ANESTHESIA:   general  EBL:  Total I/O In: 800 [I.V.:800] Out: -   BLOOD ADMINISTERED:none  DRAINS: none   LOCAL MEDICATIONS USED:  MARCAINE     SPECIMEN:  No Specimen  DISPOSITION OF SPECIMEN:  N/A  COUNTS:  YES  TOURNIQUET:   Total Tourniquet Time Documented: Upper Arm (Right) - 51 minutes Total: Upper Arm (Right) - 51 minutes   DICTATION: .Other Dictation: Dictation Number [id number]  PLAN OF CARE: Discharge to home after PACU  PATIENT DISPOSITION:  PACU - hemodynamically stable.

## 2014-12-17 NOTE — Anesthesia Postprocedure Evaluation (Signed)
  Anesthesia Post-op Note  Patient: [name]  Procedure(s) Performed: Procedure(s): RIGHT HAND IRRIGATION AND DEBRIDEMENT, (Right) REPAIR EXTENSOR TENDON (Right)  Patient Location: PACU  Anesthesia Type: General   Level of Consciousness: awake, alert  and oriented  Airway and Oxygen Therapy: Patient Spontanous Breathing  Post-op Pain: mild  Post-op Assessment: Post-op Vital signs reviewed  Post-op Vital Signs: Reviewed  Last Vitals:  Filed Vitals:   12/17/14 1300  BP: 125/82  Pulse: 92  Temp: 36.9 C  Resp: 20    Complications: No apparent anesthesia complications
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
## 2014-12-17 NOTE — Op Note (Signed)
[id number] 

## 2014-12-18 ENCOUNTER — Encounter (HOSPITAL_BASED_OUTPATIENT_CLINIC_OR_DEPARTMENT_OTHER): Payer: Self-pay | Admitting: Orthopedic Surgery
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
## 2014-12-23 ENCOUNTER — Ambulatory Visit: Payer: Medicaid Other | Admitting: *Deleted

## 2014-12-24 ENCOUNTER — Encounter: Payer: Self-pay | Admitting: Occupational Therapy

## 2014-12-24 ENCOUNTER — Ambulatory Visit: Payer: Medicaid Other | Attending: Orthopedic Surgery | Admitting: Occupational Therapy

## 2014-12-24 DIAGNOSIS — R29898 Other symptoms and signs involving the musculoskeletal system: Secondary | ICD-10-CM

## 2014-12-24 DIAGNOSIS — M25641 Stiffness of right hand, not elsewhere classified: Secondary | ICD-10-CM | POA: Insufficient documentation

## 2014-12-24 DIAGNOSIS — M79641 Pain in right hand: Secondary | ICD-10-CM | POA: Insufficient documentation

## 2014-12-24 DIAGNOSIS — M6289 Other specified disorders of muscle: Secondary | ICD-10-CM | POA: Insufficient documentation

## 2014-12-24 NOTE — Therapy (Signed)
[facility] [address] [place], [place], [address] Phone: [phone]   Fax:  [phone]  Occupational Therapy Evaluation  Patient Details  Name: [name] MRN: [id number] Date of Birth: November 13, 1985 Referring Provider: Dr. Betha Loa  Encounter Date: 12/24/2014      OT End of Session - 12/24/14 1256    Visit Number 1   Date for OT Re-Evaluation 02/22/15   Authorization Type MCD   Authorization Time Period Awaiting authorization   OT Start Time 0930   OT Stop Time 1045   OT Time Calculation (min) 75 min   Equipment Utilized During Treatment splint   Activity Tolerance Patient tolerated treatment well      Past Medical History  Diagnosis Date  . Anxiety   . Asthma     no current med.  . Hand laceration involving tendon 12/13/2014    right  . Open metacarpal fracture 12/13/2014    right    Past Surgical History  Procedure Laterality Date  . Cesarean section  03/02/2013  . Wisdom tooth extraction    . Dilation and curettage of uterus  01/12/2006    suction D & C  . Tubal ligation    . Bone tumor excision Left     great toe  . I&d extremity Right 12/17/2014    Procedure: RIGHT HAND IRRIGATION AND DEBRIDEMENT,;  Surgeon: Betha Loa, MD;  Location: [facility];  Service: Orthopedics;  Laterality: Right;  . Repair extensor tendon Right 12/17/2014    Procedure: REPAIR EXTENSOR TENDON;  Surgeon: Betha Loa, MD;  Location: [facility];  Service: Orthopedics;  Laterality: Right;    There were no vitals filed for this visit.  Visit Diagnosis:  Pain of right hand - Plan: Ot plan of care cert/re-cert  Weakness of right hand - Plan: Ot plan of care cert/re-cert  Stiffness of joint, hand, right - Plan: Ot plan of care cert/re-cert      Subjective Assessment - 12/24/14 0939    Subjective  I understand (re: precautions and splint wear and care)   Patient Stated Goals To  get my dominant hand better   Currently in Pain? Yes   Pain Score 7    Pain Location Hand   Pain Orientation Right   Pain Descriptors / Indicators Burning;Throbbing   Pain Type Surgical pain   Pain Onset In the past 7 days   Pain Frequency Constant   Aggravating Factors  movement   Pain Relieving Factors pain meds, sleeping           [facility] OT Assessment - 12/24/14 0001    Assessment   Diagnosis I & D Rt hand with extensor tendon repair. Op notes also report metacarpal head fx   Referring Provider Dr. Betha Loa   Onset Date 12/17/14   Assessment Pt arrived fully wrapped and protected with boyfriend   Prior Therapy none for this episode of care   Precautions   Precautions Other (comment)   Precaution Comments To wear splint at all times currently (except for hygiene care)   Required Braces or Orthoses Other Brace/Splint   Other Brace/Splint Splint with wrist neutral, MP's and IP's for ring/small finger in extension per MD orders. Also included MP's of index and long finger for extra protection and according to extensor tendon protocol   Balance Screen   Has the patient fallen in the past 6 months No   Has the patient had a decrease in  activity level because of a fear of falling?  No   Is the patient reluctant to leave their home because of a fear of falling?  No   Home  Environment   Lives With Significant other   Prior Function   Level of Independence Independent   ADL   ADL comments Pt requires mod assist for all BADLS   Written Expression   Dominant Hand Right   Handwriting --  UNABLE    Edema   Edema Little to no edema Rt hand                  OT Treatments/Exercises (OP) - 12/24/14 0001    ADLs   ADL Comments Carefully unwrapped soft cast/post surgical dressing keeping hand/wrist immobolized in same position. Cleaned hand/forearm with soapy washcloth, and used saline to clean stitches and instructed pt/boyfriend how to perform at home. Rinsed and dried  completely and applied finger stockinette over digits and stockinette over hand/forearm all prior to splint fabrication. Also, educated pt/boyfriend on precautions and risk of tendon re-rupture if not adhering to precautions. Pt instructed to only clean as instructed but will need splint on while bathing/showering at this time (place grocery bag over arm)   Splinting   Splinting Fabricated and fitted splint with wrist neutral, MP's and IP's of ring/small finger in extension per MD orders. Also included MP's of index and long finger in extension for extra protection and per extensor tendon protocol. Issued splint               OT Education - 12/24/14 1033    Education provided Yes   Education Details splint wear and care, precautions, hygiene care   Person(s) Educated Patient;Caregiver(s)   Methods Explanation;Demonstration;Handout   Comprehension Verbalized understanding          OT Short Term Goals - 12/24/14 1302    OT SHORT TERM GOAL #1   Title Independent with splint wear and care (all STG's due 01/22/15)    Baseline issued, but may need adjustments to splint   Time 4   Period Weeks   Status On-going   OT SHORT TERM GOAL #2   Title Independent with initial HEP    Baseline Dependent d/t current precautions   Time 4   Period Weeks   Status New   OT SHORT TERM GOAL #3   Title Pain less than or equal to 5/10 Rt hand   Baseline 7/10   Time 4   Period Weeks   Status New           OT Long Term Goals - 12/24/14 1304    OT LONG TERM GOAL #1   Title Independent with updated HEP (all LTG'S due 02/22/15)   Baseline dependent d/t current precautions   Time 8   Period Weeks   Status New   OT LONG TERM GOAL #2   Title Pt to demo 90% or greater full composite flexion Rt dominant hand for all grasping/releasing    Baseline dependent d/t current precautions   Time 8   Period Weeks   Status New   OT LONG TERM GOAL #3   Title Grip strength Rt hand to be 25 lbs. or greater  for opening jars/containers   Baseline unable to assess d/t current precautions   Time 8   Period Weeks   Status New   OT LONG TERM GOAL #4   Title Pt to return to using Rt dominant hand for all BADLS  Baseline Dependent, using Lt non dominant hand   Time 8   Period Weeks   Status New               Plan - 12/24/14 1257    Clinical Impression Statement Pt is a 29 y.o. female who presents to outpatient rehab fully wrapped/protected s/p I & D and extensor tendon repair Rt hand (16109) on 12/17/14. Op note also reports metacarpal fx. Pt seen today for evaluation and splinting needs.    Pt will benefit from skilled therapeutic intervention in order to improve on the following deficits (Retired) Decreased coordination;Decreased range of motion;Improper body mechanics;Decreased safety awareness;Increased edema;Impaired sensation;Decreased knowledge of precautions;Decreased skin integrity;Decreased activity tolerance;Decreased scar mobility;Impaired UE functional use;Pain;Decreased strength   Rehab Potential Good   Clinical Impairments Affecting Rehab Potential insurance visit limit   OT Frequency --  3 visits over 8 week duration   OT Treatment/Interventions Self-care/ADL training;Therapeutic exercise;Patient/family education;Manual Therapy;Splinting;Ultrasound;Cryotherapy;Parrafin;DME and/or AE instruction;Compression bandaging;Therapeutic activities;Electrical Stimulation;Fluidtherapy;Scar mobilization;Moist Heat;Contrast Bath;Passive range of motion   Plan splint adjustments prn, to begin A/ROM in 4 weeks or sooner if MD clears. Pt returns 01/14/15 for progression of therapy   Consulted and Agree with Plan of Care Patient;Family member/caregiver   Family Member Consulted boyfriend        Problem List Patient Active Problem List   Diagnosis Date Noted  . UTI (lower urinary tract infection) 07/21/2014  . Hypokalemia 07/21/2014  . Hyperglycemia 07/21/2014  . Pyelonephritis  07/21/2014    Kelli Churn, OTR/L 12/24/2014, 1:10 PM  [facility] [address] [place], [place], [address] Phone: [phone]   Fax:  [phone]  Name: MAHREEN SCHEWE MRN: [id number] Date of Birth: 05-22-1985

## 2014-12-24 NOTE — Patient Instructions (Signed)
SPLINT WEAR AND CARE   WEARING SCHEDULE:  Wear splint at ALL times except for hygiene care 1x/day. (Still keep on for bathing/showering)  PURPOSE:  To prevent movement and for protection until injury can heal  CARE OF SPLINT:  Keep splint away from heat sources including: stove, radiator or furnace, or a car in sunlight. The splint can melt and will no longer fit you properly  Keep away from pets and children  Clean the splint with rubbing alcohol 1-2 times per day.  * During this time, make sure you also clean your hand/arm as instructed by your therapist and/or perform dressing changes as needed. Then dry hand/arm completely before replacing splint. (When cleaning hand/arm, keep it immobilized in same position until splint is replaced)  PRECAUTIONS/POTENTIAL PROBLEMS: *If you notice or experience increased pain, swelling, numbness, or a lingering reddened area from the splint: Contact your therapist immediately by calling [phone]. You must wear the splint for protection, but we will get you scheduled for adjustments as quickly as possible.  (If only straps or hooks need to be replaced and NO adjustments to the splint need to be made, just call the office ahead and let them know you are coming in)  If you have any medical concerns or signs of infection, please call your doctor immediately

## 2014-12-28 ENCOUNTER — Emergency Department (HOSPITAL_COMMUNITY)
Admission: EM | Admit: 2014-12-28 | Discharge: 2014-12-29 | Disposition: A | Discharge status: Other Acute Inpatient Hospital | Payer: No Typology Code available for payment source | Attending: Emergency Medicine | Admitting: Emergency Medicine

## 2014-12-28 ENCOUNTER — Encounter (HOSPITAL_COMMUNITY): Payer: Self-pay | Admitting: Emergency Medicine

## 2014-12-28 DIAGNOSIS — Z79899 Other long term (current) drug therapy: Secondary | ICD-10-CM | POA: Insufficient documentation

## 2014-12-28 DIAGNOSIS — Z87828 Personal history of other (healed) physical injury and trauma: Secondary | ICD-10-CM | POA: Insufficient documentation

## 2014-12-28 DIAGNOSIS — Z91040 Latex allergy status: Secondary | ICD-10-CM | POA: Insufficient documentation

## 2014-12-28 DIAGNOSIS — F322 Major depressive disorder, single episode, severe without psychotic features: Secondary | ICD-10-CM | POA: Diagnosis present

## 2014-12-28 DIAGNOSIS — F329 Major depressive disorder, single episode, unspecified: Secondary | ICD-10-CM | POA: Insufficient documentation

## 2014-12-28 DIAGNOSIS — F1210 Cannabis abuse, uncomplicated: Secondary | ICD-10-CM | POA: Insufficient documentation

## 2014-12-28 DIAGNOSIS — Z87891 Personal history of nicotine dependence: Secondary | ICD-10-CM | POA: Insufficient documentation

## 2014-12-28 DIAGNOSIS — F32A Depression, unspecified: Secondary | ICD-10-CM

## 2014-12-28 DIAGNOSIS — Z3202 Encounter for pregnancy test, result negative: Secondary | ICD-10-CM | POA: Insufficient documentation

## 2014-12-28 DIAGNOSIS — Z23 Encounter for immunization: Secondary | ICD-10-CM | POA: Insufficient documentation

## 2014-12-28 DIAGNOSIS — F1410 Cocaine abuse, uncomplicated: Secondary | ICD-10-CM | POA: Insufficient documentation

## 2014-12-28 DIAGNOSIS — Z8781 Personal history of (healed) traumatic fracture: Secondary | ICD-10-CM | POA: Insufficient documentation

## 2014-12-28 DIAGNOSIS — J45909 Unspecified asthma, uncomplicated: Secondary | ICD-10-CM | POA: Insufficient documentation

## 2014-12-28 DIAGNOSIS — F419 Anxiety disorder, unspecified: Secondary | ICD-10-CM | POA: Insufficient documentation

## 2014-12-28 LAB — COMPREHENSIVE METABOLIC PANEL
ALT: 17 U/L (ref 14–54)
AST: 23 U/L (ref 15–41)
Albumin: 4.6 g/dL (ref 3.5–5.0)
Alkaline Phosphatase: 55 U/L (ref 38–126)
Anion gap: 11 (ref 5–15)
BUN: 10 mg/dL (ref 6–20)
CO2: 25 mmol/L (ref 22–32)
Calcium: 9.1 mg/dL (ref 8.9–10.3)
Chloride: 104 mmol/L (ref 101–111)
Creatinine, Ser: 0.84 mg/dL (ref 0.44–1.00)
GFR calc Af Amer: >60 mL/min (ref >60)
GFR calc non Af Amer: >60 mL/min (ref >60)
Glucose, Bld: 96 mg/dL (ref 65–99)
Potassium: 2.8 mmol/L — ABNORMAL LOW (ref 3.5–5.1)
Sodium: 140 mmol/L (ref 135–145)
Total Bilirubin: 0.6 mg/dL (ref 0.3–1.2)
Total Protein: 8 g/dL (ref 6.5–8.1)

## 2014-12-28 LAB — RAPID URINE DRUG SCREEN, HOSP PERFORMED
Amphetamines: NOT DETECTED
Barbiturates: NOT DETECTED
Benzodiazepines: NOT DETECTED
Cocaine: POSITIVE — AB
Opiates: NOT DETECTED
Tetrahydrocannabinol: POSITIVE — AB

## 2014-12-28 LAB — CBC
HCT: 39.5 % (ref 36.0–46.0)
Hemoglobin: 13.1 g/dL (ref 12.0–15.0)
MCH: 28.4 pg (ref 26.0–34.0)
MCHC: 33.2 g/dL (ref 30.0–36.0)
MCV: 85.5 fL (ref 78.0–100.0)
Platelets: 312 10*3/uL (ref 150–400)
RBC: 4.62 MIL/uL (ref 3.87–5.11)
RDW: 15.3 % (ref 11.5–15.5)
WBC: 9.7 10*3/uL (ref 4.0–10.5)

## 2014-12-28 LAB — SALICYLATE LEVEL: Salicylate Lvl: <4 mg/dL (ref 2.8–30.0)

## 2014-12-28 LAB — I-STAT BETA HCG BLOOD, ED (MC, WL, AP ONLY): I-stat hCG, quantitative: <5 m[IU]/mL (ref <5)

## 2014-12-28 LAB — ACETAMINOPHEN LEVEL: Acetaminophen (Tylenol), Serum: <10 ug/mL — ABNORMAL LOW (ref 10–30)

## 2014-12-28 LAB — ETHANOL: Alcohol, Ethyl (B): <5 mg/dL (ref <5)

## 2014-12-28 MED ORDER — PROMETHAZINE HCL 25 MG PO TABS
12.5000 mg | ORAL_TABLET | Freq: Four times a day (QID) | ORAL | Status: DC | PRN
  Administered 2014-12-29: 12.5 mg via ORAL
  Filled 2014-12-28: qty 1

## 2014-12-28 MED ORDER — POTASSIUM CHLORIDE CRYS ER 20 MEQ PO TBCR
40.0000 meq | EXTENDED_RELEASE_TABLET | Freq: Once | ORAL | Status: AC
  Administered 2014-12-28: 40 meq via ORAL
  Filled 2014-12-28: qty 2

## 2014-12-28 MED ORDER — FLUOXETINE HCL 20 MG PO CAPS
40.0000 mg | ORAL_CAPSULE | Freq: Every day | ORAL | Status: DC
  Administered 2014-12-29: 40 mg via ORAL
  Filled 2014-12-28 (×2): qty 2

## 2014-12-28 MED ORDER — TETANUS-DIPHTH-ACELL PERTUSSIS 5-2.5-18.5 LF-MCG/0.5 IM SUSP
0.5000 mL | Freq: Once | INTRAMUSCULAR | Status: AC
  Administered 2014-12-28: 0.5 mL via INTRAMUSCULAR
  Filled 2014-12-28: qty 0.5

## 2014-12-28 MED ORDER — SULFAMETHOXAZOLE-TRIMETHOPRIM 800-160 MG PO TABS
1.0000 | ORAL_TABLET | Freq: Two times a day (BID) | ORAL | Status: DC
  Administered 2014-12-29 (×2): 1 via ORAL
  Filled 2014-12-28 (×2): qty 1

## 2014-12-28 MED ORDER — OXYCODONE-ACETAMINOPHEN 5-325 MG PO TABS
1.0000 | ORAL_TABLET | Freq: Four times a day (QID) | ORAL | Status: DC | PRN
  Administered 2014-12-29: 1 via ORAL
  Filled 2014-12-28: qty 1

## 2014-12-28 NOTE — ED Provider Notes (Signed)
CSN: [id number]     Arrival date & time 12/28/14  1825 History   First MD Initiated Contact with Patient 12/28/14 2001     Chief Complaint  Patient presents with  . Suicidal  . Medical Clearance     (Consider location/radiation/quality/duration/timing/severity/associated sxs/prior Treatment) The history is provided by the patient.  Vanice SarahVictoria E Graven is a 29 y.o. female hx of anxiety, asthma here with suicidal ideation, alcohol abuse. Patient states that she's been very depressed and anxious. States that she feel like she wants to kill herself. She tries to cut herself on her leg and arms. Denies any homicidal ideations or hallucinations. She tries to drink some alcohol and take Percocet. Of note, patient recently had a tendon laceration and had surgery recently and still on Bactrim and oxycodone. Denies any fevers or chills.   Past Medical History  Diagnosis Date  . Anxiety   . Asthma     no current med.  . Hand laceration involving tendon 12/13/2014    right  . Open metacarpal fracture 12/13/2014    right   Past Surgical History  Procedure Laterality Date  . Cesarean section  03/02/2013  . Wisdom tooth extraction    . Dilation and curettage of uterus  01/12/2006    suction D & C  . Tubal ligation    . Bone tumor excision Left     great toe  . I&d extremity Right 12/17/2014    Procedure: RIGHT HAND IRRIGATION AND DEBRIDEMENT,;  Surgeon: Betha LoaKevin Kuzma, MD;  Location: [facility];  Service: Orthopedics;  Laterality: Right;  . Repair extensor tendon Right 12/17/2014    Procedure: REPAIR EXTENSOR TENDON;  Surgeon: Betha LoaKevin Kuzma, MD;  Location: [facility];  Service: Orthopedics;  Laterality: Right;   Family History  Problem Relation Age of Onset  . Hypertension Father    Social History  Substance Use Topics  . Smoking status: Former Smoker -- 0.00 packs/day for 0 years    Quit date: 02/12/2006  . Smokeless tobacco: Never Used  . Alcohol Use: 0.0  oz/week    0 Standard drinks or equivalent per week     Comment: occasionally   OB History    Gravida Para Term Preterm AB TAB SAB Ectopic Multiple Living   4 2 2  1  1   3      Review of Systems  Psychiatric/Behavioral: Positive for dysphoric mood.  All other systems reviewed and are negative.     Allergies  Prednisone and Latex  Home Medications   Prior to Admission medications   Medication Sig Start Date End Date Taking? Authorizing Provider  FLUoxetine (PROZAC) 40 MG capsule Take 40 mg by mouth daily. 06/08/14 06/08/15 Yes Historical Provider, MD  LORazepam (ATIVAN) 0.5 MG tablet Take 0.5 mg by mouth at bedtime as needed for anxiety.  07/20/14  Yes Historical Provider, MD  oxyCODONE-acetaminophen (PERCOCET/ROXICET) 5-325 MG per tablet Take 1-2 tablets by mouth every 4 (four) hours as needed for moderate pain or severe pain. 07/23/14  Yes Houston SirenPeter Le, MD  promethazine (PHENERGAN) 12.5 MG tablet Take 1 tablet (12.5 mg total) by mouth every 6 (six) hours as needed for nausea or vomiting. 12/17/14  Yes Betha LoaKevin Kuzma, MD  sulfamethoxazole-trimethoprim (BACTRIM DS) 800-160 MG tablet Take 1 tablet by mouth 2 (two) times daily. 12/17/14  Yes Betha LoaKevin Kuzma, MD  TURMERIC PO Take 1 tablet by mouth daily.    Yes Historical Provider, MD  vitamin B-12 (CYANOCOBALAMIN) 100 MCG tablet  Take 100 mcg by mouth daily.   Yes Historical Provider, MD  vitamin C (ASCORBIC ACID) 500 MG tablet Take 500 mg by mouth daily.   Yes Historical Provider, MD  oxyCODONE-acetaminophen (PERCOCET) 5-325 MG tablet 1-2 tabs po q6 hours prn pain Patient not taking: Reported on 12/28/2014 12/17/14   Betha Loa, MD   LMP 12/01/2014 (Approximate) Physical Exam  Constitutional:  Depressed   HENT:  Head: Normocephalic.  Eyes: Conjunctivae are normal. Pupils are equal, round, and reactive to light.  Neck: Normal range of motion. Neck supple.  Cardiovascular: Normal rate, regular rhythm and normal heart sounds.   Pulmonary/Chest:  Effort normal and breath sounds normal. No respiratory distress. She has no wheezes. She has no rales.  Abdominal: Soft. Bowel sounds are normal. She exhibits no distension. There is no tenderness. There is no rebound.  Musculoskeletal:  R hand splint in place. Good capillary refill   Neurological: She is alert.  Skin: Skin is warm and dry.  Multiple cut marks on wrist and thighs. No obvious laceration or cellulitis   Psychiatric:  Depressed   Nursing note and vitals reviewed.   ED Course  Procedures (including critical care time) Labs Review Labs Reviewed  COMPREHENSIVE METABOLIC PANEL - Abnormal; Notable for the following:    Potassium 2.8 (*)    All other components within normal limits  ACETAMINOPHEN LEVEL - Abnormal; Notable for the following:    Acetaminophen (Tylenol), Serum <10 (*)    All other components within normal limits  URINE RAPID DRUG SCREEN, HOSP PERFORMED - Abnormal; Notable for the following:    Cocaine POSITIVE (*)    Tetrahydrocannabinol POSITIVE (*)    All other components within normal limits  ETHANOL  SALICYLATE LEVEL  CBC  I-STAT BETA HCG BLOOD, ED ([facility], [facility], [facility] ONLY)    Imaging Review No results found. I have personally reviewed and evaluated these images and lab results as part of my medical decision-making.   EKG Interpretation None      MDM   Final diagnoses:  None   ROYALTI SCHAUF is a 29 y.o. female here with suicidal ideation. Has multiple self inflicted wounds. Updated tdap, continue bactrim (started by Dr. Merlyn Lot a week ago, need 2 week course).   11:34 PM K 2.8. Supplemented. UDS + cocaine and marijuana. Will consult TTS. Medically cleared    Richardean Canal, MD 12/28/14 [phone]

## 2014-12-28 NOTE — ED Notes (Signed)
TTS in with pt. 

## 2014-12-28 NOTE — ED Notes (Signed)
Patient has large red duffle bag and patient belongings bag with black boots at nurses' station.

## 2014-12-28 NOTE — ED Notes (Signed)
Patient presents for SI and medical clearance, denies plan, history of cutting. Reports last cut herself on bilateral legs yesterday. Denies HI/AVH. Reports ETOH use and weed. Percocet 1500.
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
## 2014-12-29 ENCOUNTER — Inpatient Hospital Stay (HOSPITAL_COMMUNITY)
Admission: AD | Admit: 2014-12-29 | Discharge: 2015-01-05 | DRG: 885 | Payer: No Typology Code available for payment source | Source: Intra-hospital | Attending: Psychiatry | Admitting: Psychiatry

## 2014-12-29 DIAGNOSIS — R45851 Suicidal ideations: Secondary | ICD-10-CM | POA: Diagnosis present

## 2014-12-29 DIAGNOSIS — F4310 Post-traumatic stress disorder, unspecified: Secondary | ICD-10-CM | POA: Diagnosis present

## 2014-12-29 DIAGNOSIS — G4700 Insomnia, unspecified: Secondary | ICD-10-CM | POA: Diagnosis present

## 2014-12-29 DIAGNOSIS — F410 Panic disorder [episodic paroxysmal anxiety] without agoraphobia: Secondary | ICD-10-CM | POA: Diagnosis present

## 2014-12-29 DIAGNOSIS — F1020 Alcohol dependence, uncomplicated: Secondary | ICD-10-CM | POA: Diagnosis present

## 2014-12-29 DIAGNOSIS — Z8249 Family history of ischemic heart disease and other diseases of the circulatory system: Secondary | ICD-10-CM | POA: Diagnosis not present

## 2014-12-29 DIAGNOSIS — F322 Major depressive disorder, single episode, severe without psychotic features: Secondary | ICD-10-CM | POA: Diagnosis present

## 2014-12-29 DIAGNOSIS — Z87891 Personal history of nicotine dependence: Secondary | ICD-10-CM | POA: Diagnosis not present

## 2014-12-29 MED ORDER — OXYCODONE-ACETAMINOPHEN 5-325 MG PO TABS
1.0000 | ORAL_TABLET | Freq: Four times a day (QID) | ORAL | Status: DC | PRN
  Administered 2014-12-29 – 2015-01-05 (×13): 1 via ORAL
  Filled 2014-12-29 (×13): qty 1

## 2014-12-29 MED ORDER — POTASSIUM CHLORIDE CRYS ER 20 MEQ PO TBCR
20.0000 meq | EXTENDED_RELEASE_TABLET | Freq: Two times a day (BID) | ORAL | Status: AC
  Administered 2014-12-29 – 2014-12-30 (×2): 20 meq via ORAL
  Filled 2014-12-29 (×4): qty 1

## 2014-12-29 MED ORDER — POTASSIUM CHLORIDE CRYS ER 20 MEQ PO TBCR
20.0000 meq | EXTENDED_RELEASE_TABLET | Freq: Two times a day (BID) | ORAL | Status: DC
  Administered 2014-12-29: 20 meq via ORAL
  Filled 2014-12-29: qty 1

## 2014-12-29 MED ORDER — IBUPROFEN 600 MG PO TABS
600.0000 mg | ORAL_TABLET | Freq: Three times a day (TID) | ORAL | Status: DC | PRN
  Administered 2014-12-30 – 2015-01-05 (×9): 600 mg via ORAL
  Filled 2014-12-29 (×10): qty 1

## 2014-12-29 MED ORDER — ONDANSETRON HCL 4 MG PO TABS: 4.0000 mg | ORAL_TABLET | Freq: Three times a day (TID) | ORAL | Status: DC | PRN

## 2014-12-29 MED ORDER — TRAZODONE HCL 50 MG PO TABS
50.0000 mg | ORAL_TABLET | Freq: Every evening | ORAL | Status: DC | PRN
  Administered 2014-12-29: 50 mg via ORAL
  Filled 2014-12-29 (×4): qty 1

## 2014-12-29 MED ORDER — SULFAMETHOXAZOLE-TRIMETHOPRIM 800-160 MG PO TABS
1.0000 | ORAL_TABLET | Freq: Two times a day (BID) | ORAL | Status: AC
  Administered 2014-12-29 – 2015-01-04 (×12): 1 via ORAL
  Filled 2014-12-29 (×14): qty 1

## 2014-12-29 MED ORDER — LORAZEPAM 0.5 MG PO TABS
0.5000 mg | ORAL_TABLET | Freq: Every evening | ORAL | Status: DC | PRN
  Administered 2014-12-29 – 2015-01-04 (×4): 0.5 mg via ORAL
  Filled 2014-12-29 (×4): qty 1

## 2014-12-29 MED ORDER — ACETAMINOPHEN 325 MG PO TABS
650.0000 mg | ORAL_TABLET | ORAL | Status: DC | PRN
  Administered 2014-12-29: 650 mg via ORAL
  Filled 2014-12-29: qty 2

## 2014-12-29 MED ORDER — FLUOXETINE HCL 20 MG PO CAPS
40.0000 mg | ORAL_CAPSULE | Freq: Every day | ORAL | Status: DC
  Administered 2014-12-30 – 2015-01-05 (×7): 40 mg via ORAL
  Filled 2014-12-29: qty 28
  Filled 2014-12-29 (×9): qty 2

## 2014-12-29 MED ORDER — VITAMIN B-12 100 MCG PO TABS
100.0000 ug | ORAL_TABLET | Freq: Every day | ORAL | Status: DC
  Administered 2014-12-30 – 2015-01-05 (×7): 100 ug via ORAL
  Filled 2014-12-29 (×10): qty 1

## 2014-12-29 MED ORDER — PROMETHAZINE HCL 25 MG PO TABS
12.5000 mg | ORAL_TABLET | Freq: Four times a day (QID) | ORAL | Status: DC | PRN
  Administered 2014-12-29 – 2015-01-05 (×13): 12.5 mg via ORAL
  Filled 2014-12-29 (×13): qty 1

## 2014-12-29 MED ORDER — VITAMIN B-12 100 MCG PO TABS
100.0000 ug | ORAL_TABLET | Freq: Every day | ORAL | Status: DC
  Administered 2014-12-29: 100 ug via ORAL
  Filled 2014-12-29: qty 1

## 2014-12-29 MED ORDER — LORAZEPAM 0.5 MG PO TABS
0.5000 mg | ORAL_TABLET | Freq: Every evening | ORAL | Status: DC | PRN
  Administered 2014-12-29: 0.5 mg via ORAL
  Filled 2014-12-29: qty 1

## 2014-12-29 MED ORDER — ACETAMINOPHEN 325 MG PO TABS
650.0000 mg | ORAL_TABLET | ORAL | Status: DC | PRN
Start: 2014-12-29 — End: 2015-01-05
  Administered 2015-01-01 – 2015-01-04 (×4): 650 mg via ORAL
  Filled 2014-12-29 (×4): qty 2

## 2014-12-29 MED ORDER — POTASSIUM CHLORIDE CRYS ER 20 MEQ PO TBCR
40.0000 meq | EXTENDED_RELEASE_TABLET | Freq: Once | ORAL | Status: AC
  Administered 2014-12-29: 40 meq via ORAL
  Filled 2014-12-29: qty 2

## 2014-12-29 MED ORDER — IBUPROFEN 200 MG PO TABS: 600.0000 mg | ORAL_TABLET | Freq: Three times a day (TID) | ORAL | Status: DC | PRN

## 2014-12-29 NOTE — ED Notes (Signed)
[facility]inda[facility]. Pending [place] transport.
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
## 2014-12-29 NOTE — ED Notes (Signed)
Pt AAO x 3, no distress noted. Calm & cooperative, withdrawn.  Splint to Rt wrist in place.  Monitoring for safety, Q 15 min checks in effect.  Pending [facility] transfer between 8.30pm-9pm.

## 2014-12-29 NOTE — Progress Notes (Signed)
Confirmed with ED Cm that her pcp is still Dr Lysbeth GalasNyland

## 2014-12-29 NOTE — ED Notes (Signed)
Pt presents with complaint of SI, plan to cut self.  Self inflicted laceration noted to rt wrist. Dressing and splint intact and dry.  Denies HI or AVH.  Feeling hopeless.  Pt reports she drinks 1/2-1 pint of whiskey per day.  Diagnosed with Borderline Personality, ADD, PTSD and Depression in the past.  AAO x 3, no acute distress noted, calm & cooperative, monitoring for safety, Q 15 min checks in effect.  Pts Potassium level is 2.8.  K Dur ordered and being given.

## 2014-12-29 NOTE — ED Notes (Signed)
[facility] transport requested. 

## 2014-12-29 NOTE — BH Assessment (Signed)
[facility] Assessment Progress Note  The following facilities have been contacted to seek placement for this pt, with results as noted:  Beds available, information sent, decision pending:  [facility]   At capacity:  [facility]   Devory Mckinzie, [place] Triage Specialist [phone]

## 2014-12-29 NOTE — B[facility] Counselor (Signed)
Referral sent to [facility], [facility], [facility], [facility], [address] [facility] [facility], [facility], [facility], [facility], [facility], [facility], and [facility]  [facility]. Mare[facility] Robinette, LCAS-A, LPC-A, NCC  Counselor 12/29/2014 1:25 AM

## 2014-12-30 ENCOUNTER — Encounter (HOSPITAL_COMMUNITY): Payer: Self-pay

## 2014-12-30 DIAGNOSIS — F322 Major depressive disorder, single episode, severe without psychotic features: Principal | ICD-10-CM

## 2014-12-30 DIAGNOSIS — F4310 Post-traumatic stress disorder, unspecified: Secondary | ICD-10-CM | POA: Diagnosis present

## 2014-12-30 DIAGNOSIS — F1020 Alcohol dependence, uncomplicated: Secondary | ICD-10-CM

## 2014-12-30 MED ORDER — ZOLPIDEM TARTRATE 10 MG PO TABS
10.0000 mg | ORAL_TABLET | Freq: Every evening | ORAL | Status: DC | PRN
  Administered 2014-12-30 – 2015-01-03 (×5): 10 mg via ORAL
  Filled 2014-12-30 (×5): qty 1

## 2014-12-30 MED ORDER — ARIPIPRAZOLE 2 MG PO TABS
2.0000 mg | ORAL_TABLET | Freq: Every day | ORAL | Status: DC
  Administered 2014-12-30 – 2015-01-05 (×7): 2 mg via ORAL
  Filled 2014-12-30 (×9): qty 1
  Filled 2014-12-30: qty 14
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
## 2014-12-30 NOTE — [facility] Suicide Risk Assessment (Signed)
[facility] INPATIENT:  Family/Significant Other Suicide Prevention Education  Suicide Prevention Education:  Patient Refusal for Family/Significant Other Suicide Prevention Education: The patient [name] has refused to provide written consent for family/significant other to be provided Family/Significant Other Suicide Prevention Education during admission and/or prior to discharge.  Physician notified. SPE reviewed with patient and brochure provided. Patient encouraged to return to hospital if having suicidal thoughts, patient verbalized his/her understanding and has no further questions at this time.   [name], [place]ristin L 12/30/2014, 11:21 AM

## 2014-12-30 NOTE — [facility] Group Notes (Signed)
   [facility]  12/30/2014  8:45 AM   Participation Quality: Alert, Appropriate and Oriented  Mood/Affect: Depressed and agitated  Depression Rating: 8  Anxiety Rating: 5  Thoughts of Suicide: Pt denies SI/HI  Will you contract for safety? Yes  Current AVH: Pt denies  Plan for Discharge/Comments: Pt attended discharge planning group and actively participated in group. CSW provided pt with today's workbook. Patient reports feeling shaky and weak today. She plans to return home and sees and her PCP for medication management services.   Transportation Means: Pt reports access to transportation  Supports: No supports mentioned at this time  Samuella BruinKristin Narya Beavin, MSW, [employer]CSWA Clinical Social Worker [facility] [phone]
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
## 2014-12-30 NOTE — Progress Notes (Signed)
Patient ID: [name], female   DOB: 1985/05/31, 29 y.o.   MRN: [id number]  DAR: Pt. Denies HI and A/V Hallucinations. She reports passive SI but is able to contract for safety. She rates her depression and hopelessness 9/10 and anxiety 5/10. She reports sleep was fair, appetite is poor, energy level is low, and concentration is poor. Patient does report pain in her right wrist/hand and is receiving PRN pain medication. Her splint was cleaned and wound was cleaned and dried. Stitches appear intact and patient is able to move her fingers. Capillary refill is less than 3 seconds and fingers appear pink and are warm. Support and encouragement provided to the patient. Scheduled medications administered to patient per physician's orders. Patient is cooperative and able to come to staff for her needs. Patient is seen in the milieu interacting with peers and is attending groups. Q15 minute checks are maintained for safety.
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
## 2014-12-30 NOTE — Plan of Care (Signed)
Problem: Alteration in mood Goal: STG-Patient reports thoughts of self-harm to staff Outcome: Progressing [place] does reports thoughts of self harm/SI however is able to contract for safety.

## 2014-12-30 NOTE — Tx Team (Signed)
Initial Interdisciplinary Treatment Plan   PATIENT STRESSORS: Financial difficulties Marital or family conflict Medication change or noncompliance Substance abuse   PATIENT STRENGTHS: Ability for insight Average or above average intelligence General fund of knowledge Supportive family/friends   PROBLEM LIST: Problem List/Patient Goals Date to be addressed Date deferred Reason deferred Estimated date of resolution  depression 12/29/2014     Substance abuse 12/29/2014     Risk for suicide 12/29/2014     "get back on my medication" 12/29/2014     anxiety 12/29/2014                              DISCHARGE CRITERIA:  Ability to meet basic life and health needs Improved stabilization in mood, thinking, and/or behavior Motivation to continue treatment in a less acute level of care Withdrawal symptoms are absent or subacute and managed without 24-hour nursing intervention  PRELIMINARY DISCHARGE PLAN: Attend aftercare/continuing care group Outpatient therapy Return to previous living arrangement  PATIENT/FAMIILY INVOLVEMENT: This treatment plan has been presented to and reviewed with the patient, [name], and/or family member,  The patient and family have been given the opportunity to ask questions and make suggestions.  JEHU-APPIAH, Luise Yamamoto K 12/30/2014, 1:46 AM
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
## 2014-12-30 NOTE — Progress Notes (Signed)
Pt did not attend NA group this evening.  

## 2014-12-31 LAB — POTASSIUM: Potassium: 4.2 mmol/L (ref 3.5–5.1)

## 2014-12-31 MED ORDER — MAGNESIUM CITRATE PO SOLN
1.0000 | Freq: Once | ORAL | Status: AC
Start: 2014-12-31 — End: 2014-12-31
  Administered 2014-12-31: 1 via ORAL

## 2014-12-31 MED ORDER — AMPHETAMINE-DEXTROAMPHET ER 10 MG PO CP24
10.0000 mg | ORAL_CAPSULE | Freq: Every day | ORAL | Status: DC
  Administered 2015-01-01 – 2015-01-04 (×4): 10 mg via ORAL
  Filled 2014-12-31 (×4): qty 1

## 2014-12-31 NOTE — Plan of Care (Signed)
Problem: Diagnosis: Increased Risk For Suicide Attempt Goal: STG-Patient Will Attend All Groups On The Unit Outcome: Not Progressing Pt did not attend evening group on 12/30/14.

## 2014-12-31 NOTE — Plan of Care (Signed)
Problem: Alteration in mood Goal: LTG-Patient reports reduction in suicidal thoughts (Patient reports reduction in suicidal thoughts and is able to verbalize a safety plan for whenever patient is feeling suicidal)  Outcome: Progressing Patient denies SI or any self harm thoughts.

## 2014-12-31 NOTE — Progress Notes (Signed)
D: Patient is lethargic with anxious mood this morning.  She rates her depression and hopelessness as a 6; anxiety as a 4.  Patient's goal is "to see if my new medications are working."  She reports withdrawal symptoms as tremors, agitation and tiredness.  She reports she is sleeping and eating well.  She reports poor concentration and energy.  Patient did not attend group.  Patient requested pain and nausea medication this morning.  She denies SI/HI/AVH. A: Continue to monitor medication management and MD orders.  Safety checks completed every 15 minutes per protocol.  Offer support and encouragement as needed. R: Patient's behavior is appropriate to situation.

## 2014-12-31 NOTE — [facility] Group Notes (Signed)
[facility] LCSW Group Therapy 12/31/2014 1:15 PM Type of Therapy: Group Therapy Participation Level: Active  Participation Quality: Attentive, Sharing and Supportive  Affect: Appropriate  Cognitive: Alert and Oriented  Insight: Developing/Improving and Engaged  Engagement in Therapy: Developing/Improving and Engaged  Modes of Intervention: Activity, Clarification, Confrontation, Discussion, Education, Exploration, Limit-setting, Orientation, Problem-solving, Rapport Building, Reality Testing, Socialization and Support  Summary of Progress/Problems: Patient was attentive and engaged with speaker from [employer]. Patient was attentive to speaker while they shared their story of dealing with mental health and overcoming it. Patient expressed interest in their programs and services and received information on their agency. Patient processed ways they can relate to the speaker.   Nyella Eckels, MSW, LCSWA Clinical Social Worker [facility] [phone]   

## 2014-12-31 NOTE — Progress Notes (Signed)
[facility] MD Progress Note  12/31/2014 6:49 PM VERBIE BABIC  MRN:  [id number] Subjective:  [place] states she is at a lost. She has not heard form the BF and she plans to give him space. She states she knows she needs to get herself together. She is still endorsing the depression. Feeling somewhat sedated with a HA, not sure if secondary to the new medication. She is going to abstain from taking any of the opioids as she has a hard time tolerating them so the way she is feeling could be because of the opioids. She continues to endorse difficulty in paying and maintaining attention. Mentioned he was going to be given a trial with "ADHD medication" but she did not pursue. States this is affecting her ability to function day by day Principal Problem: <principal problem not specified> Diagnosis:   Patient Active Problem List   Diagnosis Date Noted  . PTSD (post-traumatic stress disorder) [F43.10] 12/30/2014  . 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 Alcohol use disorder, moderate, dependence (HCC) [F10.20] 12/29/2014  . UTI (lower urinary tract infection) [N39.0] 07/21/2014  . Hypokalemia [E87.6] 07/21/2014  . Hyperglycemia [R73.9] 07/21/2014  . Pyelonephritis [N12] 07/21/2014   Total Time spent with patient: 30 minutes  Past Psychiatric History: see admission H and P  Past Medical History:  Past Medical History  Diagnosis Date  . Anxiety   . Asthma     no current med.  . Hand laceration involving tendon 12/13/2014    right  . Open metacarpal fracture 12/13/2014    right    Past Surgical History  Procedure Laterality Date  . Cesarean section  03/02/2013  . Wisdom tooth extraction    . Dilation and curettage of uterus  01/12/2006    suction D & C  . Tubal ligation    . Bone tumor excision Left     great toe  . I&d extremity Right 12/17/2014    Procedure: RIGHT HAND IRRIGATION AND DEBRIDEMENT,;  Surgeon: Betha Loa, MD;  Location: [facility];  Service: Orthopedics;  Laterality: Right;  . Repair extensor tendon Right 12/17/2014    Procedure: REPAIR EXTENSOR TENDON;  Surgeon: Betha Loa, MD;  Location:  SUR[facility]ERY CENTER;  Service: Orthopedics;  Laterality: Right;   Family History:  Family History  Problem Relation Age of Onset  . Hypertension Father    Family Psychiatric  History: see Admission H and p Social History:  History  Alcohol Use  . 0.0 oz/week  . 0 Standard drinks or equivalent per week    Comment: occasionally     History  Drug Use  . Yes  . Special: Marijuana    Social History   Social History  . Marital Status: Legally Separated    Spouse Name: N/A  . Number of Children: N/A  . Years of Education: N/A   Social History Main Topics  . Smoking status: Former Smoker -- 0.00 packs/day for 0 years    Quit date: 02/12/2006  . Smokeless tobacco: Never Used  . Alcohol Use: 0.0 oz/week    0 Standard drinks or equivalent per week     Comment: occasionally  . Drug Use: Yes    Special: Marijuana  . Sexual Activity: Yes    Birth Control/ Protection: None   Other Topics Concern  . None   Social History Narrative   Additional Social History:  Sleep: Fair  Appetite:  Fair  Current Medications: Current Facility-Administered Medications  Medication Dose Route Frequency Provider Last Rate Last Dose  . acetaminophen (TYLENOL) tablet 650 mg  650 mg Oral Q4H PRN Earney NavyJosephine C Onuoha, NP      . Melene Muller[START ON 01/01/2015] amphetamine-dextroamphetamine (ADDERALL XR) 24 hr capsule 10 mg  10 mg Oral Daily Rachael FeeIrving A Ger Ringenberg, MD      . ARIPiprazole (ABILIFY) tablet 2 mg  2 mg Oral Daily Rachael FeeIrving A Ellenora Talton, MD   2 mg at 12/31/14 0809  . FLUoxetine (PROZAC) capsule 40 mg  40 mg Oral Daily Earney NavyJosephine C Onuoha, NP   40 mg at 12/31/14 0809  . ibuprofen (ADVIL,MOTRIN) tablet 600 mg  600 mg Oral Q8H PRN Earney NavyJosephine C Onuoha, NP   600 mg at 12/31/14 1513  . LORazepam (ATIVAN)  tablet 0.5 mg  0.5 mg Oral QHS PRN Earney NavyJosephine C Onuoha, NP   0.5 mg at 12/29/14 2316  . ondansetron (ZOFRAN) tablet 4 mg  4 mg Oral Q8H PRN Earney NavyJosephine C Onuoha, NP      . oxyCODONE-acetaminophen (PERCOCET/ROXICET) 5-325 MG per tablet 1 tablet  1 tablet Oral Q6H PRN Earney NavyJosephine C Onuoha, NP   1 tablet at 12/31/14 [id number]  . promethazine (PHENERGAN) tablet 12.5 mg  12.5 mg Oral Q6H PRN Earney NavyJosephine C Onuoha, NP   12.5 mg at 12/31/14 [id number]  . sulfamethoxazole-trimethoprim (BACTRIM DS,SEPTRA DS) 800-160 MG per tablet 1 tablet  1 tablet Oral Q12H Earney NavyJosephine C Onuoha, NP   1 tablet at 12/31/14 0809  . vitamin B-12 (CYANOCOBALAMIN) tablet 100 mcg  100 mcg Oral Daily Earney NavyJosephine C Onuoha, NP   100 mcg at 12/31/14 0809  . zolpidem (AMBIEN) tablet 10 mg  10 mg Oral QHS PRN Rachael FeeIrving A Kimaria Struthers, MD   10 mg at 12/30/14 2103    Lab Results: No results found for this or any previous visit (from the past 48 hour(s)).  Physical Findings: AIMS: Facial and Oral Movements Muscles of Facial Expression: None, normal Lips and Perioral Area: None, normal Jaw: None, normal Tongue: None, normal,Extremity Movements Upper (arms, wrists, hands, fingers): None, normal Lower (legs, knees, ankles, toes): None, normal, Trunk Movements Neck, shoulders, hips: None, normal, Overall Severity Severity of abnormal movements (highest score from questions above): None, normal Incapacitation due to abnormal movements: None, normal Patient's awareness of abnormal movements (rate only patient's report): No Awareness, Dental Status Current problems with teeth and/or dentures?: No Does patient usually wear dentures?: No  CIWA:  CIWA-Ar Total: 3 COWS:     Musculoskeletal: Strength & Muscle Tone: within normal limits Gait & Station: normal Patient leans: normal  Psychiatric Specialty Exam: Review of Systems  Constitutional: Positive for malaise/fatigue.  HENT: Negative.   Eyes: Negative.   Respiratory: Negative.   Cardiovascular: Negative.    Gastrointestinal: Negative.   Genitourinary: Negative.   Musculoskeletal: Negative.   Skin: Negative.   Neurological: Negative.   Endo/Heme/Allergies: Negative.   Psychiatric/Behavioral: Positive for depression and substance abuse. The patient is nervous/anxious.     Blood pressure 118/74, pulse 98, temperature 97.5 F (36.4 C), temperature source Oral, resp. rate 20, height 5' 2.21" (1.58 m), weight 65.318 kg (144 lb), last menstrual period 12/01/2014.Body mass index is 26.16 kg/(m^2).  General Appearance: Fairly Groomed  Patent attorneyye Contact::  Fair  Speech:  Clear and Coherent  Volume:  flucutuates  Mood:  Anxious and Depressed  Affect:  Restricted  Thought Process:  Coherent and Goal Directed  Orientation:  Full (Time, Place, and  Person)  Thought Content:  symptoms events worries concerns  Suicidal Thoughts:  No  Homicidal Thoughts:  No  Memory:  Immediate;   Fair Recent;   Fair Remote;   Fair  Judgement:  Fair  Insight:  Present  Psychomotor Activity:  Restlessness  Concentration:  easily distracted   Recall:  [employer] of Knowledge:Fair  Language: Fair  Akathisia:  No  Handed:  Right  AIMS (if indicated):     Assets:  Desire for Improvement  ADL's:  Intact  Cognition: WNL  Sleep:  Number of Hours: 6.25   Treatment Plan Summary: Daily contact with patient to assess and evaluate symptoms and progress in treatment and Medication management Supportive approach/coping skills Alcohol abuse; continue to work a relapse prevention plan Depression; continue the Prozac 40 mg with Abilify 2 mg augmentation Inattentiveness/distractibility; trial with Adderall XR 10 mg daily Insomnia; continue to work with the Ambien 10 mg HS PRN sleep Work with CBT/mindfulness Shanna Un A 12/31/2014, 6:49 PM
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
## 2014-12-31 NOTE — Progress Notes (Signed)
[facility] Group Notes:  (Nursing/MHT/Case Management/Adjunct)  Date:  12/31/2014  Time:  2100  Type of Therapy:  wrap up group  Participation Level:  Active  Participation Quality:  Attentive, Sharing and Supportive  Affect:  Appropriate  Cognitive:  Appropriate  Insight:  Improving  Engagement in Group:  Engaged  Modes of Intervention:  Clarification, Education and Support  Summary of Progress/Problems:  Shelah LewandowskySquires, Mykal Kirchman Carol 12/31/2014, 10:25 PM

## 2014-12-31 NOTE — [facility] Group Notes (Signed)
[facility] Group Notes:  (Nursing/MHT/Case Management/Adjunct)  Date:  12/31/2014  Time: 0900 AM  Type of Therapy:  Psychoeducational Skills  Patient invited to group; declined.  Cranford MonBeaudry, Gerren Hoffmeier Evans 12/31/2014, 10:22 AM

## 2014-12-31 NOTE — Progress Notes (Signed)
D: Pt has irritable affect and depressed mood.  Pt states her day has "been okay" and that her goal is "to sleep."  She reports she made it to groups today. She has passive SI, reporting she does not currently feel suicidal.  When asked if she was having thoughts of hurting others, pt states "sometimes I wanna smack people when they talk about abusing pills."  She verbally contracts for safety.  Pt denies hallucinations, reports right wrist pain of 7/10.  Pt has been visible in milieu interacting with staff and peers appropriately.  She did not attend evening group.    A: Introduced self to pt.  Met with pt 1:1 and offered support and encouragement.  Actively listened to pt.  Medications administered per order.  PRN medications administered for sleep, pain, and nausea.    R: Pt is compliant with medications.  She verbally contracts for safety and reports that she will inform staff of needs and concerns.  Will continue to monitor and assess.

## 2014-12-31 NOTE — Clinical Social Work Note (Signed)
Referral faxed to [facility] for review.   Isyss Espinal, MSW, LCSWA Clinical Social Worker [facility] [phone]  

## 2015-01-01 MED ORDER — GUAIFENESIN ER 600 MG PO TB12
600.0000 mg | ORAL_TABLET | Freq: Two times a day (BID) | ORAL | Status: DC
Start: 2015-01-01 — End: 2015-01-05
  Administered 2015-01-01 – 2015-01-05 (×8): 600 mg via ORAL
  Filled 2015-01-01 (×13): qty 1

## 2015-01-01 MED ORDER — INFLUENZA VAC SPLIT QUAD 0.5 ML IM SUSY
0.5000 mL | PREFILLED_SYRINGE | INTRAMUSCULAR | Status: AC
  Administered 2015-01-02: 0.5 mL via INTRAMUSCULAR
  Filled 2015-01-01: qty 0.5

## 2015-01-01 NOTE — Progress Notes (Signed)
D: Pt has anxious affect and mood.  Pt reports her goal is "I guess to get out of this funky mood."  She reports she is still depressed.  Pt denies SI/HI, denies hallucinations, reports right wrist pain of 5/10.  Pt has been visible in milieu interacting with peers and staff appropriately.  She attended evening group.   A: Met with pt 1:1 and provided support and encouragement.  Medications administered per order.  PRN medication offered for pain, pt refused.  PRN medication administered for anxiety and sleep.  R: Pt is compliant with scheduled medication.  Pt verbally contracts for safety.  Will continue to monitor and assess.

## 2015-01-01 NOTE — [facility] Group Notes (Signed)
   [facility]  01/01/2015  8:45 AM   Participation Quality: Alert, Appropriate and Oriented  Mood/Affect: Depressed and Flat  Depression Rating: 8  Anxiety Rating: 5  Thoughts of Suicide: Pt endorses SI  Will you contract for safety? Yes  Current AVH: Pt denies  Plan for Discharge/Comments: Pt attended discharge planning group and actively participated in group. CSW provided pt with today's workbook. Patient endorsing passive SI this morning but contracts for safety. She continues to be interested in [facility] at discharge, referral pending.  Transportation Means: Pt reports access to transportation  Supports: No supports mentioned at this time  Samuella BruinKristin Jaja Switalski, MSW, [employer] IncLCSWA Clinical Social Worker [facility] [phone]

## 2015-01-01 NOTE — [facility] Group Notes (Signed)
[facility] LCSW Group Therapy 01/01/2015 1:15 PM Type of Therapy: Group Therapy Participation Level: Active  Participation Quality: Attentive, Sharing and Supportive  Affect: Anxious  Cognitive: Alert and Oriented  Insight: Developing/Improving and Engaged  Engagement in Therapy: Developing/Improving and Engaged  Modes of Intervention: Clarification, Confrontation, Discussion, Education, Exploration, Limit-setting, Orientation, Problem-solving, Rapport Building, Dance movement psychotherapisteality Testing, Socialization and Support  Summary of Progress/Problems: The topic for today was feelings about relapse. Pt discussed what relapse prevention is to them and identified triggers that they are on the path to relapse. Pt processed their feeling towards relapse and was able to relate to peers. Pt discussed coping skills that can be used for relapse prevention. Patient identified dealing with feelings of BPD, PTSD, depression, abandonment, feeling overwhelmed at home by turning to alcohol. Patient recognizes this as unhealthy and expressed her concerns about if she were to go home directly at discharge without further treatment. CSW and other group members provided patient with emotional support and encouragement.   [name], MSW, [employer]LCSWA Clinical Social Worker [facility] [phone]

## 2015-01-01 NOTE — Plan of Care (Signed)
Problem: Ineffective individual coping Goal: STG: Patient will remain free from self harm Outcome: Progressing Pt has not harmed herself tonight.  She denies thoughts of self-harm, denies SI.  Pt verbally contracts for safety.

## 2015-01-01 NOTE — Progress Notes (Signed)
Pt attended AA group this evening.  

## 2015-01-01 NOTE — Progress Notes (Signed)
D:Per patient self inventory form pt reports she slept good last night with the use of sleep medication. Pt reports a fair appetite, low energy level, poor concentration. Pt rates depression 8/10, hopelessness 8/10, anxiety 6/10- all on 0-10 scale, 10 being the worse. Pt reports pass SI. Denies AVH. Denies HI. Pt c/o pain 7/10 in right wrist. Pt reports her goal for the day is "not call boyfriend" and that she will "keep busy" to help meet her goal. Pt noted pacing the hall. Laughing and socializing with peers.  A:Special checks q 15 mins in place for safety. Medication administered per MD order (see eMAR) PRN medication given for pain. Encouragement and support provided.  R:Pt verbally contracts for safety. Pt reports she feel safe in the hospital and denies plan to hurt self. Pt reports she feels comfortable talking to staff. Complaint with medication regimen. Safety maintained. Will continue to monitor.

## 2015-01-01 NOTE — Progress Notes (Signed)
[facility] MD Progress Note  01/01/2015 5:20 PM [name]  MRN:  [id number] Subjective:  [name] is still trying to get herself together. She has not heard from her BF so she thinks the relationship is over. She worries about her son but states that she knows he is well taken care of by her mother. She states she really needs to go to a residential treatment program. She is afraid of relapsing and getting back to the same situation she was in before Principal Problem: <principal problem not specified> Diagnosis:   Patient Active Problem List   Diagnosis Date Noted  . PTSD (post-traumatic stress disorder) [F43.10] 12/30/2014  . 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 Alcohol use disorder, moderate, dependence (HCC) [F10.20] 12/29/2014  . UTI (lower urinary tract infection) [N39.0] 07/21/2014  . Hypokalemia [E87.6] 07/21/2014  . Hyperglycemia [R73.9] 07/21/2014  . Pyelonephritis [N12] 07/21/2014   Total Time spent with patient: 30 minutes  Past Psychiatric History: see admission H and P  Past Medical History:  Past Medical History  Diagnosis Date  . Anxiety   . Asthma     no current med.  . Hand laceration involving tendon 12/13/2014    right  . Open metacarpal fracture 12/13/2014    right    Past Surgical History  Procedure Laterality Date  . Cesarean section  03/02/2013  . Wisdom tooth extraction    . Dilation and curettage of uterus  01/12/2006    suction D & C  . Tubal ligation    . Bone tumor excision Left     great toe  . I&d extremity Right 12/17/2014    Procedure: RIGHT HAND IRRIGATION AND DEBRIDEMENT,;  Surgeon: Betha Loa, MD;  Location: [facility];  Service: Orthopedics;  Laterality: Right;  . Repair extensor tendon Right 12/17/2014    Procedure: REPAIR EXTENSOR TENDON;  Surgeon: Betha Loa, MD;  Location: [facility];  Service: Orthopedics;  Laterality: Right;   Family History:  Family History   Problem Relation Age of Onset  . Hypertension Father    Family Psychiatric  History: see admission H and P Social History:  History  Alcohol Use  . 0.0 oz/week  . 0 Standard drinks or equivalent per week    Comment: occasionally     History  Drug Use  . Yes  . Special: Marijuana    Social History   Social History  . Marital Status: Legally Separated    Spouse Name: N/A  . Number of Children: N/A  . Years of Education: N/A   Social History Main Topics  . Smoking status: Former Smoker -- 0.00 packs/day for 0 years    Quit date: 02/12/2006  . Smokeless tobacco: Never Used  . Alcohol Use: 0.0 oz/week    0 Standard drinks or equivalent per week     Comment: occasionally  . Drug Use: Yes    Special: Marijuana  . Sexual Activity: Yes    Birth Control/ Protection: None   Other Topics Concern  . None   Social History Narrative   Additional Social History:                         Sleep: Fair  Appetite:  Fair  Current Medications: Current Facility-Administered Medications  Medication Dose Route Frequency Provider Last Rate Last Dose  . acetaminophen (TYLENOL) tablet 650 mg  650 mg Oral Q4H PRN Earney Navy, NP  650 mg at 01/01/15 1447  . amphetamine-dextroamphetamine (ADDERALL XR) 24 hr capsule 10 mg  10 mg Oral Daily Rachael Fee, MD   10 mg at 01/01/15 0836  . ARIPiprazole (ABILIFY) tablet 2 mg  2 mg Oral Daily Rachael Fee, MD   2 mg at 01/01/15 0836  . FLUoxetine (PROZAC) capsule 40 mg  40 mg Oral Daily Earney Navy, NP   40 mg at 01/01/15 0836  . guaiFENesin (MUCINEX) 12 hr tablet 600 mg  600 mg Oral BID Rachael Fee, MD   600 mg at 01/01/15 1641  . ibuprofen (ADVIL,MOTRIN) tablet 600 mg  600 mg Oral Q8H PRN Earney Navy, NP   600 mg at 01/01/15 1110  . [START ON 01/02/2015] Influenza vac split quadrivalent PF (FLUARIX) injection 0.5 mL  0.5 mL Intramuscular Tomorrow-1000 Rachael Fee, MD      . LORazepam (ATIVAN) tablet 0.5 mg   0.5 mg Oral QHS PRN Earney Navy, NP   0.5 mg at 12/31/14 2029  . ondansetron (ZOFRAN) tablet 4 mg  4 mg Oral Q8H PRN Earney Navy, NP      . oxyCODONE-acetaminophen (PERCOCET/ROXICET) 5-325 MG per tablet 1 tablet  1 tablet Oral Q6H PRN Earney Navy, NP   1 tablet at 12/31/14 [phone]  . promethazine (PHENERGAN) tablet 12.5 mg  12.5 mg Oral Q6H PRN Earney Navy, NP   12.5 mg at 12/31/14 9604  . sulfamethoxazole-trimethoprim (BACTRIM DS,SEPTRA DS) 800-160 MG per tablet 1 tablet  1 tablet Oral Q12H Earney Navy, NP   1 tablet at 01/01/15 0836  . vitamin B-12 (CYANOCOBALAMIN) tablet 100 mcg  100 mcg Oral Daily Earney Navy, NP   100 mcg at 01/01/15 5409  . zolpidem (AMBIEN) tablet 10 mg  10 mg Oral QHS PRN Rachael Fee, MD   10 mg at 12/31/14 2126    Lab Results:  Results for orders placed or performed during the hospital encounter of 12/29/14 (from the past 48 hour(s))  Potassium     Status: None   Collection Time: 12/31/14  6:40 PM  Result Value Ref Range   Potassium 4.2 3.5 - 5.1 mmol/L    Comment: Performed at [facility]    Physical Findings: AIMS: Facial and Oral Movements Muscles of Facial Expression: None, normal Lips and Perioral Area: None, normal Jaw: None, normal Tongue: None, normal,Extremity Movements Upper (arms, wrists, hands, fingers): None, normal Lower (legs, knees, ankles, toes): None, normal, Trunk Movements Neck, shoulders, hips: None, normal, Overall Severity Severity of abnormal movements (highest score from questions above): None, normal Incapacitation due to abnormal movements: None, normal Patient's awareness of abnormal movements (rate only patient's report): No Awareness, Dental Status Current problems with teeth and/or dentures?: No Does patient usually wear dentures?: No  CIWA:  CIWA-Ar Total: 3 COWS:     Musculoskeletal: Strength & Muscle Tone: within normal limits Gait & Station: normal Patient  leans: normal  Psychiatric Specialty Exam: Review of Systems  Constitutional: Negative.   HENT: Negative.   Eyes: Negative.   Respiratory: Negative.   Cardiovascular: Negative.   Gastrointestinal: Negative.   Genitourinary: Negative.   Musculoskeletal: Negative.   Skin: Negative.   Neurological: Negative.   Endo/Heme/Allergies: Negative.   Psychiatric/Behavioral: Positive for depression and substance abuse. The patient is nervous/anxious.     Blood pressure 136/92, pulse 116, temperature 97.9 F (36.6 C), temperature source Oral, resp. rate 16, height 5' 2.21" (1.58 m), weight  65.318 kg (144 lb), last menstrual period 12/01/2014.Body mass index is 26.16 kg/(m^2).  General Appearance: Fairly Groomed  Patent attorneyye Contact::  Fair  Speech:  Clear and Coherent  Volume:  Decreased  Mood:  Anxious and worried  Affect:  anxious worried  Thought Process:  Coherent and Goal Directed  Orientation:  Full (Time, Place, and Person)  Thought Content:  symptoms events worries concerns  Suicidal Thoughts:  No  Homicidal Thoughts:  No  Memory:  Immediate;   Fair Recent;   Fair Remote;   Fair  Judgement:  Fair  Insight:  Present  Psychomotor Activity:  Restlessness  Concentration:  Fair  Recall:  [employer]Fair  Fund of Knowledge:Fair  Language: Fair  Akathisia:  No  Handed:  Right  AIMS (if indicated):     Assets:  Desire for Improvement Social Support  ADL's:  Intact  Cognition: WNL  Sleep:  Number of Hours: 5.5   Treatment Plan Summary: Daily contact with patient to assess and evaluate symptoms and progress in treatment and Medication management Supportive approach/coping skills Alcohol dependence; continue to work a relapse prevention plan Depression: continue the Prozac 40 mg daily, augment with Abilify 2 mg daily Inattentiveness distractibility; pursue the Adderall XR 10 mg in AM Insomnia; continue the Ambien 10 mg HS PRN sleep Work with CBT/mindfulness Explore residential treatment  options Virgene Tirone A 01/01/2015, 5:20 PM

## 2015-01-02 MED ORDER — HYDROXYZINE HCL 25 MG PO TABS
25.0000 mg | ORAL_TABLET | Freq: Four times a day (QID) | ORAL | Status: DC | PRN
  Administered 2015-01-02 – 2015-01-05 (×5): 25 mg via ORAL
  Filled 2015-01-02 (×6): qty 1

## 2015-01-02 NOTE — Progress Notes (Signed)
Writer has observed patient up in the dayroom interacting appropriately with select peers. Writer spoke with patient 1:1 and she reports feeling sad because her boyfriend/ ex-boyfriend has not called and she has been trying not to break down and call him. She attended group this evening requested medication to aid with pain. Support and encouragement given, safety maintained on unit witih 15 min checks.

## 2015-01-02 NOTE — [facility] Group Notes (Signed)
The focus of this group is to educate the patient on the purpose and policies of crisis stabilization and provide a format to answer questions about their admission.  The group details unit policies and expectations of patients while admitted.  Patient did not attend 0900 nurse education orientation group this morning.  Patient stayed in bed.   

## 2015-01-02 NOTE — Progress Notes (Signed)
[facility] MD Progress Note  01/02/2015 1:36 PM [name]  MRN:  [id number] Subjective:  "I am still trying to get myself together".  Objective: [name] is awake, alert and oriented X4 , found attending group session.  Denies suicidal or homicidal ideation. Denies auditory or visual hallucination and does not appear to be responding to internal stimuli. Patient interacts well with staff and others. Patient reports she is medication compliant without mediation side effects. Report learning new coping skills. States her depression 10/10. Patient states "I am still trying to get myself together." Reports 3 children and a stepfather as her new support system. Reports a good appetite other wise and resting well. Support, encouragement and reassurance was provided.   Principal Problem: Severe major depression without psychotic features (HCC) Diagnosis:   Patient Active Problem List   Diagnosis Date Noted  . PTSD (post-traumatic stress disorder) [F43.10] 12/30/2014  . 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 Alcohol use disorder, moderate, dependence (HCC) [F10.20] 12/29/2014  . UTI (lower urinary tract infection) [N39.0] 07/21/2014  . Hypokalemia [E87.6] 07/21/2014  . Hyperglycemia [R73.9] 07/21/2014  . Pyelonephritis [N12] 07/21/2014   Total Time spent with patient: 45 minutes  Past Psychiatric History: see admission H and P  Past Medical History:  Past Medical History  Diagnosis Date  . Anxiety   . Asthma     no current med.  . Hand laceration involving tendon 12/13/2014    right  . Open metacarpal fracture 12/13/2014    right    Past Surgical History  Procedure Laterality Date  . Cesarean section  03/02/2013  . Wisdom tooth extraction    . Dilation and curettage of uterus  01/12/2006    suction D & C  . Tubal ligation    . Bone tumor excision Left     great toe  . I&d extremity Right 12/17/2014    Procedure: RIGHT HAND IRRIGATION  AND DEBRIDEMENT,;  Surgeon: Betha Loa, MD;  Location: [facility];  Service: Orthopedics;  Laterality: Right;  . Repair extensor tendon Right 12/17/2014    Procedure: REPAIR EXTENSOR TENDON;  Surgeon: Betha Loa, MD;  Location: [facility];  Service: Orthopedics;  Laterality: Right;   Family History:  Family History  Problem Relation Age of Onset  . Hypertension Father    Family Psychiatric  History: see admission H and P Social History:  History  Alcohol Use  . 0.0 oz/week  . 0 Standard drinks or equivalent per week    Comment: occasionally     History  Drug Use  . Yes  . Special: Marijuana    Social History   Social History  . Marital Status: Legally Separated    Spouse Name: N/A  . Number of Children: N/A  . Years of Education: N/A   Social History Main Topics  . Smoking status: Former Smoker -- 0.00 packs/day for 0 years    Quit date: 02/12/2006  . Smokeless tobacco: Never Used  . Alcohol Use: 0.0 oz/week    0 Standard drinks or equivalent per week     Comment: occasionally  . Drug Use: Yes    Special: Marijuana  . Sexual Activity: Yes    Birth Control/ Protection: None   Other Topics Concern  . None   Social History Narrative   Additional Social History:               Sleep: Fair  Appetite:  Fair  Current Medications: Current Facility-Administered Medications  Medication Dose Route Frequency Provider Last Rate Last Dose  . acetaminophen (TYLENOL) tablet 650 mg  650 mg Oral Q4H PRN Earney NavyJosephine C Onuoha, NP   650 mg at 01/01/15 1447  . amphetamine-dextroamphetamine (ADDERALL XR) 24 hr capsule 10 mg  10 mg Oral Daily Rachael FeeIrving A Lugo, MD   10 mg at 01/02/15 0800  . ARIPiprazole (ABILIFY) tablet 2 mg  2 mg Oral Daily Rachael FeeIrving A Lugo, MD   2 mg at 01/02/15 0800  . FLUoxetine (PROZAC) capsule 40 mg  40 mg Oral Daily Earney NavyJosephine C Onuoha, NP   40 mg at 01/02/15 0800  . guaiFENesin (MUCINEX) 12 hr tablet 600 mg  600 mg Oral BID  Rachael FeeIrving A Lugo, MD   600 mg at 01/02/15 0800  . hydrOXYzine (ATARAX/VISTARIL) tablet 25 mg  25 mg Oral Q6H PRN Beau FannyJohn C Withrow, FNP   25 mg at 01/02/15 1224  . ibuprofen (ADVIL,MOTRIN) tablet 600 mg  600 mg Oral Q8H PRN Earney NavyJosephine C Onuoha, NP   600 mg at 01/02/15 0803  . LORazepam (ATIVAN) tablet 0.5 mg  0.5 mg Oral QHS PRN Earney NavyJosephine C Onuoha, NP   0.5 mg at 12/31/14 2029  . ondansetron (ZOFRAN) tablet 4 mg  4 mg Oral Q8H PRN Earney NavyJosephine C Onuoha, NP      . oxyCODONE-acetaminophen (PERCOCET/ROXICET) 5-325 MG per tablet 1 tablet  1 tablet Oral Q6H PRN Earney NavyJosephine C Onuoha, NP   1 tablet at 01/02/15 1121  . promethazine (PHENERGAN) tablet 12.5 mg  12.5 mg Oral Q6H PRN Earney NavyJosephine C Onuoha, NP   12.5 mg at 01/02/15 1121  . sulfamethoxazole-trimethoprim (BACTRIM DS,SEPTRA DS) 800-160 MG per tablet 1 tablet  1 tablet Oral Q12H Earney NavyJosephine C Onuoha, NP   1 tablet at 01/02/15 0800  . vitamin B-12 (CYANOCOBALAMIN) tablet 100 mcg  100 mcg Oral Daily Earney NavyJosephine C Onuoha, NP   100 mcg at 01/02/15 0800  . zolpidem (AMBIEN) tablet 10 mg  10 mg Oral QHS PRN Rachael FeeIrving A Lugo, MD   10 mg at 01/01/15 2201    Lab Results:  Results for orders placed or performed during the hospital encounter of 12/29/14 (from the past 48 hour(s))  Potassium     Status: None   Collection Time: 12/31/14  6:40 PM  Result Value Ref Range   Potassium 4.2 3.5 - 5.1 mmol/L    Comment[facility]    Physical Findings: AIMS: Facial and Oral Movements Muscles of Facial Expression: None, normal Lips and Perioral Area: None, normal Jaw: None, normal Tongue: None, normal,Extremity Movements Upper (arms, wrists, hands, fingers): None, normal Lower (legs, knees, ankles, toes): None, normal, Trunk Movements Neck, shoulders, hips: None, normal, Overall Severity Severity of abnormal movements (highest score from questions above): None, normal Incapacitation due to abnormal movements: None, normal Patient's awareness of  abnormal movements (rate only patient's report): No Awareness, Dental Status Current problems with teeth and/or dentures?: No Does patient usually wear dentures?: No  CIWA:  CIWA-Ar Total: 1 COWS:  COWS Total Score: 2  Musculoskeletal: Strength & Muscle Tone: within normal limits Gait & Station: normal Patient leans: normal  Psychiatric Specialty Exam: Review of Systems  Constitutional: Negative.   HENT: Negative.   Eyes: Negative.   Respiratory: Negative.   Cardiovascular: Negative.   Gastrointestinal: Negative.   Genitourinary: Negative.   Musculoskeletal: Positive for joint pain.       Right-arm split from punching a mirror  Skin: Negative.  Neurological: Negative.   Endo/Heme/Allergies: Negative.   Psychiatric/Behavioral: Positive for depression and substance abuse. The patient is nervous/anxious.   All other systems reviewed and are negative.   Blood pressure 126/76, pulse 99, temperature 98 F (36.7 C), temperature source Oral, resp. rate 16, height 5' 2.21" (1.58 m), weight 65.318 kg (144 lb), last menstrual period 12/01/2014.Body mass index is 26.16 kg/(m^2).  General Appearance: Casual  Eye Contact::  Fair  Speech:  Clear and Coherent  Volume:  Decreased  Mood:  Anxious and worried  Affect:  anxious worried  Thought Process:  Coherent and Goal Directed  Orientation:  Full (Time, Place, and Person)  Thought Content:  Hallucinations: None and symptoms events worries concerns  Suicidal Thoughts:  No  Homicidal Thoughts:  No  Memory:  Immediate;   Fair Recent;   Fair Remote;   Fair  Judgement:  Fair  Insight:  Present  Psychomotor Activity:  Normal  Concentration:  Fair  Recall:  [employer] of Knowledge:Fair  Language: Fair  Akathisia:  No  Handed:  Right  AIMS (if indicated):     Assets:  Desire for Improvement Social Support Vocational/Educational  ADL's:  Intact  Cognition: WNL  Sleep:  Number of Hours: 5.5   I agree with current treatment plan  01/02/2015  Treatment Plan Summary: Daily contact with patient to assess and evaluate symptoms and progress in treatment and Medication management Supportive approach/coping skills Alcohol dependence; continue to work a relapse prevention plan Continue the Prozac 40 mg daily, augment with Abilify 2 mg daily- Depression pursue the Adderall XR 10 mg in AM-Inattentiveness distractibility; Continue the Ambien 10 mg HS PRN sleep-Insomnia; Work with CBT/mindfulness Explore residential treatment options   Oneta Rack FNP- [facility]  01/02/2015, 1:36 PM Agree with NP Progress Note, as above  Nehemiah Massed, MD

## 2015-01-02 NOTE — [facility] Group Notes (Signed)
[facility] Group Notes:  (Clinical Social Work)   12/12/2014     10:00-11:00AM  Summary of Progress/Problems:   In today's process group a decisional balance exercise was used to explore in depth the perceived benefits and costs of alcohol and drugs, as well as the  benefits and costs of replacing these with healthy coping skills.  Patients listed healthy and unhealthy coping techniques, determining with CSW guidance that unhealthy coping techniques work initially, but eventually become harmful.  Motivational Interviewing and the whiteboard were utilized for the exercises.  The patient expressed that the unhealthy coping she often uses is drinking, while "smoking weed and doing yoga" are her healthy techniques.  She was unable to accept the group's pointing out various reasons marijuana is not actually healthy.  Type of Therapy:  Group Therapy - Process   Participation Level:  Active  Participation Quality:  Attentive and Sharing  Affect:  Blunted and Depressed  Cognitive:  Appropriate and Oriented  Insight:  Developing/Improving  Engagement in Therapy:  Engaged  Modes of Intervention:  Education, Motivational Interviewing  Ambrose MantleMareida Grossman-Orr, LCSW 01/02/2015, 12:29 PM

## 2015-01-02 NOTE — Progress Notes (Addendum)
New sleeve ordered and received for patient's R arm to go under her splint to protect her skin.

## 2015-01-02 NOTE — Progress Notes (Signed)
Patient stated she does have thoughts of hurting girl patient on [address].  Patient stated this girl gets on her nerves, is loud, and she does not want to be around her.  Patient contracts for safety.  Patient stated she does have suicidal thoughts, no plan, contracts for safety.  Denied A/V hallucinations.  D:  Patient's self inventory sheet, patient sleeps good, sleep medication is helpful.  Fair appetite, normal energy level, poor concentration.  Rated depression, hopeless and anxiety #7.  Does have withdrawals, tremors, cravings, agitatio, irritability.  SI off/on, contracts for safety.  Physical pain, worst pain #6 in past 24 hours, hand.  Pain medication is helpful.  Goal is to stay calm.  Plan is to not hit the annoying girl.   No discharge plans. A:  Medications administered per MD orders.  Emotional support and encouragement given patient. R:  Denied A/V hallucinations.  SI, no plan, contracts for safety.  HI to girl on [address] who is loud and annoys her, contracts for safety.  Denied A/V hallucinations.

## 2015-01-02 NOTE — Progress Notes (Signed)
Writer has observed patient up in the dayroom interacting appropriately with peers. Writer spoke with her 1:1 and she reports having had a " shitty day." She reports that another patient on the [address] has been annoying her today and she has been tuning her out when they go to meals. Writer spoke with patient about her incident with the scratches she make today with a screw found in her room. She reports that she was feeling pretty bad and started to hurt self but stopped herself. She verbally contracted with Clinical research associatewriter to come to staff and talk her feelings over if the urge came about again. She has been observed up in the dayroom interacting appropriately with peers and she attended AA meeting tonight. Support given and safety maintained on unit with 15 min checks.

## 2015-01-02 NOTE — Plan of Care (Signed)
Problem: Consults Goal: Depression Patient Education See Patient Education Module for education specifics.  Outcome: Progressing Nurse discussed depression/coping skills with patient.        

## 2015-01-02 NOTE — Progress Notes (Signed)
1200  Patient scratched her lower L leg, 2 scratches, one broke the skin and one scratch did not break the skin.  Patient stated she used a paper clip.  Room searched and a screw was found in her night stand and removed.  Informed NP.  Vistaril prn order obtained and given to patient.  Patient stated she does have SI thoughts off/on, contracts for safety.  Stated she does have HI thoughts off/on, contracts for safety.  Denied A/V hallucinations.  Patient went to dining room for lunch. Respirations even and unlabored.  No signs/symptoms of pain/distress noted on patient's face/body movements.  Safety maintained with 15 minute checks.

## 2015-01-02 NOTE — Progress Notes (Signed)
Patient did attend the evening speaker AA meeting.  

## 2015-01-02 NOTE — Plan of Care (Signed)
Problem: Alteration in mood & ability to function due to Goal: STG-Pt will be introduced to the 12-step program of recovery (Patient will be introduced to the 12-step program of recovery and disease concept of addiction)  Outcome: Progressing Patient attended AA group this evening.     

## 2015-01-03 DIAGNOSIS — F322 Major depressive disorder, single episode, severe without psychotic features: Secondary | ICD-10-CM | POA: Insufficient documentation

## 2015-01-03 NOTE — Progress Notes (Signed)
Nursing Progress Note: 7-7p  D- Mood is depressed and anxious,rates anxiety at 6/10. Affect is blunted and appropriate. Pt is able to contract for safety, reports having passive S/I. Continues to have difficulty staying asleep.Continues to c/o R arm pain with some relive from percocet    A - Observed pt interacting in group and in the milieu.Support and encouragement offered, safety maintained with q 15 minutes. Group discussion included healthy coping skills. Pt reports she is grateful her ex husband is taking care of her children and she doesn't have to worry.  R-Contracts for safety and continues to follow treatment plan, working on learning new coping skills.

## 2015-01-03 NOTE — Progress Notes (Signed)
Patient did attend the evening speaker AA meeting.  

## 2015-01-03 NOTE — [facility] Group Notes (Signed)
[facility] Group Notes:  (Nursing/MHT/Case Management/Adjunct)  Date:  01/03/2015  Time:  1:45 pm  Type of Therapy:  Nurse Education  Participation Level:  Active  Participation Quality:  Appropriate and Sharing  Affect:  Appropriate  Cognitive:  Alert and Appropriate  Insight:  Appropriate, Good and Improving  Engagement in Group:  Developing/Improving and Supportive  Modes of Intervention:  Discussion and Problem-solving  Summary of Progress/Problems:Group focused on identifying healthy support systems , pt identi fied group members are her support system also has a friend that is currently drinking. Peers encouraged pt to informed friend she can't be around her while drinking.  Jimmey Ralpherez, Colyn Miron M 01/03/2015, 3:03 PM

## 2015-01-03 NOTE — [facility] Group Notes (Signed)
[facility] Group Notes:  (Clinical Social Work)  01/03/2015  10:00-11:00AM  Summary of Progress/Problems:   The main focus of today's process group was to   1)  discuss the importance of adding supports  2)  define health supports versus unhealthy supports  3)  identify the patient's current unhealthy supports and plan how to handle them  4)  Identify the patient's current healthy supports and plan what to add.  An emphasis was placed on using counselor, doctor, therapy groups, 12-step groups, and problem-specific support groups to expand supports.    The patient expressed full comprehension of the concepts presented, and agreed that there is a need to add more supports.  The patient stated her soon-to-be ex-husband has been supportive while she has been in the hospital, but almost to an intrusive level.  She talked at length about how she intends to put up some boundaries with him as well as with some others.  She would like to go to a 14-day rehab leaving the hospital in order to get additional strength to pursue sobriety.  Type of Therapy:  Process Group with Motivational Interviewing  Participation Level:  Active  Participation Quality:  Attentive, Sharing and Supportive  Affect:  Anxious  Cognitive:  Appropriate and Oriented  Insight:  Engaged  Engagement in Therapy:  Engaged  Modes of Intervention:   Education, Support and Processing, Activity  Ambrose MantleMareida Grossman-Orr, LCSW 01/03/2015

## 2015-01-03 NOTE — Progress Notes (Signed)
[facility]  01/03/2015 10:50 AM [name]  MRN:  [id number] Subjective:  "I am doing okay, just dealing with a lot today".  Objective: [name] is awake, alert and oriented X4 , found interacting with peers in dayroom.  Denies suicidal or homicidal ideation. Denies auditory or visual hallucination and does not appear to be responding to internal stimuli. Patient interacts well with staff and others. Patient reports she is medication compliant. States "something is making me feel not myself' I think its the Adderall or Percocet. She states "I don't need the Percocet I am going to take motrin instead".  Report learning new coping skills to take time to communicate with others about feelings. States her depression is still 10/10. Patient states "I am doing okay, dealing with a lot today.try" Reports  Her soon to be ex husband is moving in to help her with her 3 children and a stepfather. States that her ex husband is addicted to Heroin and is the main reason why she was hospitalized.  Reports a good appetite and resting well. Support, encouragement and reassurance was provided.   Principal Problem: Severe major depression without psychotic features (HCC) Diagnosis:   Patient Active Problem List   Diagnosis Date Noted  . Major depressive disorder, single episode, severe, without mention of psychotic behavior [F32.2]   . PTSD (post-traumatic stress disorder) [F43.10] 12/30/2014  . 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 Alcohol use disorder, moderate, dependence (HCC) [F10.20] 12/29/2014  . UTI (lower urinary tract infection) [N39.0] 07/21/2014  . Hypokalemia [E87.6] 07/21/2014  . Hyperglycemia [R73.9] 07/21/2014  . Pyelonephritis [N12] 07/21/2014   Total Time spent with patient: 45 minutes  Past Psychiatric History: see admission H and P  Past Medical History:  Past Medical History  Diagnosis Date  . Anxiety   . Asthma     no  current med.  . Hand laceration involving tendon 12/13/2014    right  . Open metacarpal fracture 12/13/2014    right    Past Surgical History  Procedure Laterality Date  . Cesarean section  03/02/2013  . Wisdom tooth extraction    . Dilation and curettage of uterus  01/12/2006    suction D & C  . Tubal ligation    . Bone tumor excision Left     great toe  . I&d extremity Right 12/17/2014    Procedure: RIGHT HAND IRRIGATION AND DEBRIDEMENT,;  Surgeon: Betha LoaKevin Kuzma, MD;  Location: [facility];  Service: Orthopedics;  Laterality: Right;  . Repair extensor tendon Right 12/17/2014    Procedure: REPAIR EXTENSOR TENDON;  Surgeon: Betha LoaKevin Kuzma, MD;  Location: [facility];  Service: Orthopedics;  Laterality: Right;   Family History:  Family History  Problem Relation Age of Onset  . Hypertension Father    Family Psychiatric  History: see admission H and P Social History:  History  Alcohol Use  . 0.0 oz/week  . 0 Standard drinks or equivalent per week    Comment: occasionally     History  Drug Use  . Yes  . Special: Marijuana    Social History   Social History  . Marital Status: Legally Separated    Spouse Name: N/A  . Number of Children: N/A  . Years of Education: N/A   Social History Main Topics  . Smoking status: Former Smoker -- 0.00 packs/day for 0 years    Quit date: 02/12/2006  . Smokeless tobacco: Never  Used  . Alcohol Use: 0.0 oz/week    0 Standard drinks or equivalent per week     Comment: occasionally  . Drug Use: Yes    Special: Marijuana  . Sexual Activity: Yes    Birth Control/ Protection: None   Other Topics Concern  . None   Social History Narrative   Additional Social History:               Sleep: Fair  Appetite:  Fair  Current Medications: Current Facility-Administered Medications  Medication Dose Route Frequency Provider Last Rate Last Dose  . acetaminophen (TYLENOL) tablet 650 mg  650 mg Oral Q4H PRN  Earney Navy, NP   650 mg at 01/02/15 1441  . amphetamine-dextroamphetamine (ADDERALL XR) 24 hr capsule 10 mg  10 mg Oral Daily Rachael Fee, MD   10 mg at 01/03/15 0816  . ARIPiprazole (ABILIFY) tablet 2 mg  2 mg Oral Daily Rachael Fee, MD   2 mg at 01/03/15 0817  . FLUoxetine (PROZAC) capsule 40 mg  40 mg Oral Daily Earney Navy, NP   40 mg at 01/03/15 0816  . guaiFENesin (MUCINEX) 12 hr tablet 600 mg  600 mg Oral BID Rachael Fee, MD   600 mg at 01/03/15 0817  . hydrOXYzine (ATARAX/VISTARIL) tablet 25 mg  25 mg Oral Q6H PRN Beau Fanny, FNP   25 mg at 01/02/15 2008  . ibuprofen (ADVIL,MOTRIN) tablet 600 mg  600 mg Oral Q8H PRN Earney Navy, NP   600 mg at 01/02/15 2142  . LORazepam (ATIVAN) tablet 0.5 mg  0.5 mg Oral QHS PRN Earney Navy, NP   0.5 mg at 12/31/14 2029  . ondansetron (ZOFRAN) tablet 4 mg  4 mg Oral Q8H PRN Earney Navy, NP      . oxyCODONE-acetaminophen (PERCOCET/ROXICET) 5-325 MG per tablet 1 tablet  1 tablet Oral Q6H PRN Earney Navy, NP   1 tablet at 01/03/15 0823  . promethazine (PHENERGAN) tablet 12.5 mg  12.5 mg Oral Q6H PRN Earney Navy, NP   12.5 mg at 01/03/15 0823  . sulfamethoxazole-trimethoprim (BACTRIM DS,SEPTRA DS) 800-160 MG per tablet 1 tablet  1 tablet Oral Q12H Earney Navy, NP   1 tablet at 01/03/15 0901  . vitamin B-12 (CYANOCOBALAMIN) tablet 100 mcg  100 mcg Oral Daily Earney Navy, NP   100 mcg at 01/03/15 0900  . zolpidem (AMBIEN) tablet 10 mg  10 mg Oral QHS PRN Rachael Fee, MD   10 mg at 01/02/15 2143    Lab Results:  No results found for this or any previous visit (from the past 48 hour(s)).  Physical Findings: AIMS: Facial and Oral Movements Muscles of Facial Expression: None, normal Lips and Perioral Area: None, normal Jaw: None, normal Tongue: None, normal,Extremity Movements Upper (arms, wrists, hands, fingers): None, normal Lower (legs, knees, ankles, toes): None, normal, Trunk  Movements Neck, shoulders, hips: None, normal, Overall Severity Severity of abnormal movements (highest score from questions above): None, normal Incapacitation due to abnormal movements: None, normal Patient's awareness of abnormal movements (rate only patient's report): No Awareness, Dental Status Current problems with teeth and/or dentures?: No Does patient usually wear dentures?: No  CIWA:  CIWA-Ar Total: 1 COWS:  COWS Total Score: 2  Musculoskeletal: Strength & Muscle Tone: within normal limits Gait & Station: normal Patient leans: normal  Psychiatric Specialty Exam: Review of Systems  Constitutional: Negative.   HENT: Negative.  Eyes: Negative.   Respiratory: Negative.   Cardiovascular: Negative.   Gastrointestinal: Negative.   Genitourinary: Negative.   Musculoskeletal: Positive for joint pain.       Right-arm split from punching a mirror  Skin: Negative.   Neurological: Negative.   Endo/Heme/Allergies: Negative.   Psychiatric/Behavioral: Positive for depression and substance abuse. The patient is nervous/anxious.   All other systems reviewed and are negative.   Blood pressure 122/86, pulse 120, temperature 97.5 F (36.4 C), temperature source Oral, resp. rate 20, height 5' 2.21" (1.58 m), weight 65.318 kg (144 lb), last menstrual period 12/01/2014.Body mass index is 26.16 kg/(m^2).  General Appearance: Casual  Eye Contact::  Fair  Speech:  Clear and Coherent and Normal Rate  Volume:  Normal  Mood:  Anxious and worried  Affect:  Labile and anxious worried  Thought Process:  Coherent, Goal Directed and Linear  Orientation:  Full (Time, Place, and Person)  Thought Content:  Hallucinations: None and symptoms events worries concerns  Suicidal Thoughts:  No  Homicidal Thoughts:  No  Memory:  Immediate;   Fair Recent;   Fair Remote;   Fair  Judgement:  Fair  Insight:  Present  Psychomotor Activity:  Normal  Concentration:  Fair  Recall:  [employer] of  Knowledge:Fair  Language: Fair  Akathisia:  No  Handed:  Right  AIMS (if indicated):     Assets:  Desire for Improvement Social Support Vocational/Educational  ADL's:  Intact  Cognition: WNL  Sleep:  Number of Hours: 6.75   I agree with current treatment plan 01/02/2015  Treatment Plan Summary: Daily contact with patient to assess and evaluate symptoms and progress in treatment and Medication management Supportive approach/coping skills Alcohol dependence; continue to work a relapse prevention plan Continue the Prozac 40 mg daily, augment with Abilify 2 mg daily- Depression pursue the Adderall XR 10 mg in AM-Inattentiveness distractibility; Continue the Ambien 10 mg HS PRN sleep-Insomnia; Work with CBT/mindfulness Explore residential treatment options   Oneta Rack FNP- [facility]  01/03/2015, 10:50 AM

## 2015-01-03 NOTE — Plan of Care (Signed)
Problem: Alteration in mood & ability to function due to Goal: STG-Patient will comply with prescribed medication regimen (Patient will comply with prescribed medication regimen)  Outcome: Progressing Patient is compliant with scheduled medication and request medications prn for withdrawal symptoms.

## 2015-01-04 MED ORDER — AMPHETAMINE-DEXTROAMPHET ER 5 MG PO CP24
15.0000 mg | ORAL_CAPSULE | Freq: Every day | ORAL | Status: DC
  Administered 2015-01-05: 15 mg via ORAL
  Filled 2015-01-04: qty 1

## 2015-01-04 MED ORDER — MIRTAZAPINE 15 MG PO TABS
15.0000 mg | ORAL_TABLET | Freq: Every day | ORAL | Status: DC
  Administered 2015-01-04: 15 mg via ORAL
  Filled 2015-01-04 (×2): qty 1
  Filled 2015-01-04: qty 14

## 2015-01-04 MED ORDER — AMPHETAMINE-DEXTROAMPHET ER 5 MG PO CP24
5.0000 mg | ORAL_CAPSULE | Freq: Once | ORAL | Status: AC
  Administered 2015-01-04: 5 mg via ORAL
  Filled 2015-01-04: qty 1

## 2015-01-04 NOTE — [facility] Group Notes (Signed)
Patient stated her day was okay.  She expressed she was able to accomplish a few things and set something up for when she is discharged.  Her goal was to keep her emotions in check.  [facility] after she gets her stitches removed from her hand.   Caroll RancherMarjette Keiyon Plack, MHT

## 2015-01-04 NOTE — Progress Notes (Signed)
D: Patient in the dayroom reading on approach.  Patient states she had a good day.  Patient states her right arm is in pain but states she has been taking medications that help.  Patient states her goal for today was to control her emotions.  Patient states she felt she met her goal.  Patient denies SI/HI and denies AVH. A: Staff to monitor Q 15 mins for safety.  Encouragement and support offered.  Scheduled medications administered per orders.  Ibuprofen and Ativan administered prn tonight. R: Patient remains safe on the unit.  Patient attended group tonight.  Patient visible on the unit and interacting with peers.  Patient taking administered medications.

## 2015-01-04 NOTE — Progress Notes (Signed)
[facility] MD Progress Note  01/04/2015 7:20 PM [name]  MRN:  [id number] Subjective:  [name] states that both the Adderall and the Ambien have made a big difference. States that the Adderall is really helping her to focus and be attentive in conversations in group. The Ambien helps her to sleep trough the night and wake up "refreshed" without the hang over feeling she was experiencing with the Trazodone. Upset because she cant take any of these medications once she goes to rehab. Principal Problem: Severe major depression without psychotic features (HCC) Diagnosis:   Patient Active Problem List   Diagnosis Date Noted  . Major depressive disorder, single episode, severe, without mention of psychotic behavior [F32.2]   . PTSD (post-traumatic stress disorder) [F43.10] 12/30/2014  . 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 Alcohol use disorder, moderate, dependence (HCC) [F10.20] 12/29/2014  . UTI (lower urinary tract infection) [N39.0] 07/21/2014  . Hypokalemia [E87.6] 07/21/2014  . Hyperglycemia [R73.9] 07/21/2014  . Pyelonephritis [N12] 07/21/2014   Total Time spent with patient: 30 minutes  Past Psychiatric History: see admission H and P  Past Medical History:  Past Medical History  Diagnosis Date  . Anxiety   . Asthma     no current med.  . Hand laceration involving tendon 12/13/2014    right  . Open metacarpal fracture 12/13/2014    right    Past Surgical History  Procedure Laterality Date  . Cesarean section  03/02/2013  . Wisdom tooth extraction    . Dilation and curettage of uterus  01/12/2006    suction D & C  . Tubal ligation    . Bone tumor excision Left     great toe  . I&d extremity Right 12/17/2014    Procedure: RIGHT HAND IRRIGATION AND DEBRIDEMENT,;  Surgeon: Betha Loa, MD;  Location: [facility];  Service: Orthopedics;  Laterality: Right;  . Repair extensor tendon Right 12/17/2014    Procedure: REPAIR  EXTENSOR TENDON;  Surgeon: Betha Loa, MD;  Location: [facility];  Service: Orthopedics;  Laterality: Right;   Family History:  Family History  Problem Relation Age of Onset  . Hypertension Father    Family Psychiatric  History: see admission H and P Social History:  History  Alcohol Use  . 0.0 oz/week  . 0 Standard drinks or equivalent per week    Comment: occasionally     History  Drug Use  . Yes  . Special: Marijuana    Social History   Social History  . Marital Status: Legally Separated    Spouse Name: N/A  . Number of Children: N/A  . Years of Education: N/A   Social History Main Topics  . Smoking status: Former Smoker -- 0.00 packs/day for 0 years    Quit date: 02/12/2006  . Smokeless tobacco: Never Used  . Alcohol Use: 0.0 oz/week    0 Standard drinks or equivalent per week     Comment: occasionally  . Drug Use: Yes    Special: Marijuana  . Sexual Activity: Yes    Birth Control/ Protection: None   Other Topics Concern  . None   Social History Narrative   Additional Social History:                         Sleep: Fair  Appetite:  Fair  Current Medications: Current Facility-Administered Medications  Medication Dose Route Frequency Provider Last Rate Last Dose  .  acetaminophen (TYLENOL) tablet 650 mg  650 mg Oral Q4H PRN Earney NavyJosephine C Onuoha, NP   650 mg at 01/04/15 1309  . [START ON 01/05/2015] amphetamine-dextroamphetamine (ADDERALL XR) 24 hr capsule 15 mg  15 mg Oral Daily Rachael FeeIrving A Anique Beckley, MD      . ARIPiprazole (ABILIFY) tablet 2 mg  2 mg Oral Daily Rachael FeeIrving A Lilyanah Celestin, MD   2 mg at 01/04/15 0751  . FLUoxetine (PROZAC) capsule 40 mg  40 mg Oral Daily Earney NavyJosephine C Onuoha, NP   40 mg at 01/04/15 0751  . guaiFENesin (MUCINEX) 12 hr tablet 600 mg  600 mg Oral BID Rachael FeeIrving A Philomena Buttermore, MD   600 mg at 01/04/15 1656  . hydrOXYzine (ATARAX/VISTARIL) tablet 25 mg  25 mg Oral Q6H PRN Beau FannyJohn C Withrow, FNP   25 mg at 01/04/15 1310  . ibuprofen  (ADVIL,MOTRIN) tablet 600 mg  600 mg Oral Q8H PRN Earney NavyJosephine C Onuoha, NP   600 mg at 01/03/15 1301  . LORazepam (ATIVAN) tablet 0.5 mg  0.5 mg Oral QHS PRN Earney NavyJosephine C Onuoha, NP   0.5 mg at 01/03/15 2203  . mirtazapine (REMERON) tablet 15 mg  15 mg Oral QHS Rachael FeeIrving A Devondre Guzzetta, MD      . ondansetron [facility](ZOFRAN) tablet 4 mg  4 mg Oral Q8H PRN Earney NavyJosephine C Onuoha, NP      . oxyCODONE-acetaminophen (PERCOCET/ROXICET) 5-325 MG per tablet 1 tablet  1 tablet Oral Q6H PRN Earney NavyJosephine C Onuoha, NP   1 tablet at 01/04/15 1655  . promethazine (PHENERGAN) tablet 12.5 mg  12.5 mg Oral Q6H PRN Earney NavyJosephine C Onuoha, NP   12.5 mg at 01/04/15 1655  . vitamin B-12 (CYANOCOBALAMIN) tablet 100 mcg  100 mcg Oral Daily Earney NavyJosephine C Onuoha, NP   100 mcg at 01/04/15 [id number]    Lab Results: No results found for this or any previous visit (from the past 48 hour(s)).  Physical Findings: AIMS: Facial and Oral Movements Muscles of Facial Expression: None, normal Lips and Perioral Area: None, normal Jaw: None, normal Tongue: None, normal,Extremity Movements Upper (arms, wrists, hands, fingers): None, normal Lower (legs, knees, ankles, toes): None, normal, Trunk Movements Neck, shoulders, hips: None, normal, Overall Severity Severity of abnormal movements (highest score from questions above): None, normal Incapacitation due to abnormal movements: None, normal Patient's awareness of abnormal movements (rate only patient's report): No Awareness, Dental Status Current problems with teeth and/or dentures?: No Does patient usually wear dentures?: No  CIWA:  CIWA-Ar Total: 1 COWS:  COWS Total Score: 2  Musculoskeletal: Strength & Muscle Tone: within normal limits Gait & Station: normal Patient leans: normal  Psychiatric Specialty Exam: Review of Systems  Constitutional: Negative.   HENT: Negative.   Eyes: Negative.   Respiratory: Negative.   Cardiovascular: Negative.   Gastrointestinal: Negative.   Genitourinary: Negative.    Musculoskeletal: Negative.        Pain right arm  Skin: Negative.   Neurological: Negative.   Endo/Heme/Allergies: Negative.   Psychiatric/Behavioral: Positive for depression and substance abuse. The patient is nervous/anxious.     Blood pressure 101/73, pulse 128, temperature 97.5 F (36.4 C), temperature source Oral, resp. rate 16, height 5' 2.21" (1.58 m), weight 65.318 kg (144 lb), last menstrual period 12/01/2014.Body mass index is 26.16 kg/(m^2).  General Appearance: Fairly Groomed  Patent attorneyye Contact::  Fair  Speech:  Clear and Coherent  Volume:  fluctuates  Mood:  Anxious and upset  Affect:  Tearful  Thought Process:  Coherent and Goal Directed  Orientation:  Full (Time, Place, and Person)  Thought Content:  symptoms events worries concerns  Suicidal Thoughts:  No  Homicidal Thoughts:  No  Memory:  Immediate;   Fair Recent;   Fair Remote;   Fair  Judgement:  Fair  Insight:  Present and Shallow  Psychomotor Activity:  Restlessness  Concentration:  Fair  Recall:  [employer] of Knowledge:Fair  Language: Fair  Akathisia:  No  Handed:  Right  AIMS (if indicated):     Assets:  Desire for Improvement  ADL's:  Intact  Cognition: WNL  Sleep:  Number of Hours: 5.5   Treatment Plan Summary: Daily contact with patient to assess and evaluate symptoms and progress in treatment and Medication management Supportive approach/coping skills Alcohol dependence; continue to work a relapse prevention plan Depression; continue the Prozac 40 mg with Abilify augmentation Inattentiveness/distractibility; continue the trial with Adderall increasing to 15 mg in AM Insomnia; will D/C the Ambien and try Remeron 15 mg HS Work with CBT/mindfulness Note; has an appointment at the hand clinic tomorrow morning, she might be able to go to [facility] afterwards. Will facilitate getting to the appointment Deddrick Saindon A 01/04/2015, 7:20 PM

## 2015-01-04 NOTE — Progress Notes (Signed)
D: Patient's depressive symptoms have increased.  She rates her depression and hopelessness as a 5.  She continues to experience severe anxiety, rating it a 9.  She is sleeping and eating well.  She reports withdrawal symptoms as tremors, cravings, agitation and irritability.  Her goal to day is to "continue to learn to accept things."  She has been attending groups and participating.  She is interacting well with staff and her peers. Patient denies SI/HI/AVH. A: Continue to monitor medication management and MD orders.  Safety checks completed every 15 minutes per protocol.  Offer support and encouragement as needed. R: Patient is receptive to staff; her behavior is appropriate.

## 2015-01-04 NOTE — Progress Notes (Signed)
Pt attended spiritual care group on grief and loss facilitated by chaplain Kristalynn Coddington and counseling intern Kathryn Beam. Group opened with brief discussion and psycho-social ed around grief and loss in relationships and in relation to self - identifying life patterns, circumstances, changes that cause losses. Established group norm of speaking from own life experience. Group goal of establishing open and affirming space for members to share loss and experience with grief, normalize grief experience and provide psycho social education and grief support. Group drew on brief cognitive behavioral, adlerian, and Narrative frameworks for facilitation.    

## 2015-01-04 NOTE — [facility] Group Notes (Signed)
[facility] LCSW Group Therapy   01/04/2015 1:15 p  Type of Therapy: Group Therapy   Participation Level: Active   Participation Quality: Attentive, Sharing and Supportive   Affect: Appropriate  Cognitive: Alert and Oriented   Insight: Developing/Improving and Engaged   Engagement in Therapy: Developing/Improving and Engaged   Modes of Intervention: Clarification, Confrontation, Discussion, Education, Exploration,  Limit-setting, Orientation, Problem-solving, Rapport Building, Dance movement psychotherapisteality Testing, Socialization and Support   Summary of Progress/Problems: Pt identified obstacles faced currently and processed barriers involved in overcoming these obstacles. Pt identified steps necessary for overcoming these obstacles and explored motivation (internal and external) for facing these difficulties head on. Pt further identified one area of concern in their lives and chose a goal to focus on for today.  Pt discussed, at length, her goals of seeking better mental health which she has in the past sought out by seeking medical help for the use of anti-depressant medications to combat her symptoms of depression.  Pt shared about obstacles the pt has overcome, such as putting relationships with others before herself, and how she overcame these obstacles by seeking "outside help" in the past.

## 2015-01-04 NOTE — [facility] Group Notes (Signed)
   [facility]  01/04/2015  8:45 AM   Participation Quality: Alert, Appropriate and Oriented  Mood/Affect: Anxious  Depression Rating: 5  Anxiety Rating: Reports high anxiety levels   Thoughts of Suicide: Pt declined to answer  Will you contract for safety? Yes  Current AVH: Pt denies  Plan for Discharge/Comments: Pt attended discharge planning group and actively participated in group. CSW provided pt with today's workbook. Patient reports feeling anxious as her mother and husband plan to visit this evening. Patient continues to be interested in residential treatment. Patient declined to speak with CSW further after CSW asked patient to not curse in group.  Transportation Means: Pt reports access to transportation  Supports: No supports mentioned at this time  Samuella BruinKristin Katelen Luepke, MSW, [employer]CSWA Clinical Social Worker [facility] [phone]

## 2015-01-05 MED ORDER — ZOLPIDEM TARTRATE 5 MG PO TABS: 5.0000 mg | ORAL_TABLET | Freq: Every day | ORAL | Status: DC

## 2015-01-05 MED ORDER — HYDROXYZINE HCL 50 MG PO TABS
25.0000 mg | ORAL_TABLET | Freq: Four times a day (QID) | ORAL | Status: DC | PRN
  Filled 2015-01-05: qty 1

## 2015-01-05 MED ORDER — MIRTAZAPINE 15 MG PO TABS: 15.0000 mg | ORAL_TABLET | Freq: Every day | ORAL | Status: DC

## 2015-01-05 MED ORDER — HYDROXYZINE HCL 50 MG PO TABS
25.0000 mg | ORAL_TABLET | Freq: Four times a day (QID) | ORAL | Status: DC | PRN
  Filled 2015-01-05: qty 10

## 2015-01-05 MED ORDER — OXYCODONE-ACETAMINOPHEN 5-325 MG PO TABS: 1.0000 | ORAL_TABLET | Freq: Four times a day (QID) | ORAL | Status: DC | PRN

## 2015-01-05 MED ORDER — AMPHETAMINE-DEXTROAMPHET ER 15 MG PO CP24: 15.0000 mg | ORAL_CAPSULE | Freq: Every day | ORAL | Status: DC

## 2015-01-05 MED ORDER — ARIPIPRAZOLE 2 MG PO TABS: 2.0000 mg | ORAL_TABLET | Freq: Every day | ORAL | Status: DC

## 2015-01-05 MED ORDER — LORAZEPAM 0.5 MG PO TABS: 0.5000 mg | ORAL_TABLET | Freq: Every evening | ORAL | Status: DC | PRN

## 2015-01-05 MED ORDER — HYDROXYZINE HCL 25 MG PO TABS: 25.0000 mg | ORAL_TABLET | Freq: Four times a day (QID) | ORAL | Status: DC | PRN

## 2015-01-05 MED ORDER — FLUOXETINE HCL 40 MG PO CAPS: 40.0000 mg | ORAL_CAPSULE | Freq: Every day | ORAL | Status: DC
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
## 2015-01-05 NOTE — Progress Notes (Signed)
Patient ID: [name], female   DOB: 09/10/1985, 29 y.o.   MRN: [id number]   Pt discharged home with follow up plans to attend [facility]. Pt was stable and appreciative at that time. All papers and prescriptions were given and valuables returned. Verbal understanding expressed. Denies SI/HI and A/VH. Pt given opportunity to express concerns and ask questions.

## 2015-01-05 NOTE — Progress Notes (Addendum)
  [facility] :  Will you be returning to the same living situation after discharge:  No. [facility] At discharge, do you have transportation home?: Yes,  [facility] to transport Do you have the ability to pay for your medications: Yes,  patient will be provided with prescriptions and samples at discharge  Release of information consent forms completed and in the chart;  Patient's signature needed at discharge.  Patient to Follow up at: Follow-up Information    Follow up with [facility].   Why:  Go today for continued treatment. Call if you would like to reschedule.    Contact information:   [address] [place] [phone]      Next level of care provider [facility] Link:no  Patient denies SI/HI: Patient endorses passive SI at this time but contracts for safety. She reports [facility].  Safety Planning and Suicide Prevention discussed: Yes,  with patient  Have you used any form of tobacco in the last 30 days? (Cigarettes, Smokeless Tobacco, Cigars, and/or Pipes): No  Has patient been referred to the Quitline?: N/A patient is not a smoker  [name] L 01/05/2015, 9:03 AM

## 2015-01-05 NOTE — Progress Notes (Signed)
Patient ID: [name], female   DOB: 03/10/1985, 29 y.o.   MRN: [id number]  Patient returned to unit after appointment with no distress.

## 2015-01-05 NOTE — Progress Notes (Signed)
Patient ID: [name], female   DOB: Nov 10, 1985, 29 y.o.   MRN: [id number]  [name] reported SI this morning at times without a plan. She reports she is able to contract for safety. MD Dub MikesLugo, charge nurse Huntley DecSara, Child psychotherapistsocial worker, and MHT were notified of this. Patient is going to an appointment to remove stitches via [place] with [facility]. No distress noted at this time.

## 2015-01-05 NOTE — [facility]HH Group Notes (Signed)
[facility] Group Notes:  (Nursing/MHT/Case Management/Adjunct)  Date:  01/05/2015  Time:  10:07 AM  Type of Therapy:  Nurse Education  Participation Level:  Minimal  Participation Quality:  Inattentive  Affect:  Blunted  Cognitive:  Alert  Insight:  Limited  Engagement in Group:  Lacking  Modes of Intervention:  Discussion and Education  Summary of Progress/Problems: [place] reported group and received daily booklet. She was minimal and mainly colored throughout group.   Kaipo Ardis E 01/05/2015, 10:07 AM

## 2015-01-14 ENCOUNTER — Ambulatory Visit: Payer: Medicaid Other | Admitting: Occupational Therapy

## 2015-01-21 ENCOUNTER — Encounter: Payer: Medicaid Other | Admitting: Occupational Therapy

## 2015-05-22 ENCOUNTER — Emergency Department (HOSPITAL_COMMUNITY)
Admission: EM | Admit: 2015-05-22 | Discharge: 2015-05-22 | Disposition: A | Discharge status: Home / Self Care | Payer: Medicaid Other | Attending: Emergency Medicine | Admitting: Emergency Medicine

## 2015-05-22 ENCOUNTER — Encounter (HOSPITAL_COMMUNITY): Payer: Self-pay | Admitting: Emergency Medicine

## 2015-05-22 DIAGNOSIS — Z79899 Other long term (current) drug therapy: Secondary | ICD-10-CM | POA: Diagnosis not present

## 2015-05-22 DIAGNOSIS — H109 Unspecified conjunctivitis: Secondary | ICD-10-CM | POA: Insufficient documentation

## 2015-05-22 DIAGNOSIS — J45909 Unspecified asthma, uncomplicated: Secondary | ICD-10-CM | POA: Insufficient documentation

## 2015-05-22 DIAGNOSIS — Z87891 Personal history of nicotine dependence: Secondary | ICD-10-CM | POA: Insufficient documentation

## 2015-05-22 DIAGNOSIS — H5711 Ocular pain, right eye: Secondary | ICD-10-CM | POA: Diagnosis present

## 2015-05-22 HISTORY — DX: Post-traumatic stress disorder, unspecified: F43.10

## 2015-05-22 MED ORDER — KETOROLAC TROMETHAMINE 0.5 % OP SOLN
1.0000 [drp] | Freq: Once | OPHTHALMIC | Status: AC
  Administered 2015-05-22: 1 [drp] via OPHTHALMIC
  Filled 2015-05-22: qty 5

## 2015-05-22 MED ORDER — TETRACAINE HCL 0.5 % OP SOLN
OPHTHALMIC | Status: AC
  Filled 2015-05-22: qty 4

## 2015-05-22 MED ORDER — TOBRAMYCIN 0.3 % OP SOLN
1.0000 [drp] | Freq: Once | OPHTHALMIC | Status: AC
  Administered 2015-05-22: 1 [drp] via OPHTHALMIC
  Filled 2015-05-22: qty 5

## 2015-05-22 MED ORDER — FLUORESCEIN SODIUM 1 MG OP STRP
ORAL_STRIP | OPHTHALMIC | Status: AC
  Filled 2015-05-22: qty 1

## 2015-05-22 NOTE — ED Notes (Signed)
Feels like something in her right eye for a month per pt but since waking up this morning-eyelid swollen and red, irritated

## 2015-05-22 NOTE — ED Notes (Signed)
Patient c/o right eye pain. Patient states that yesterday she felt something in her eye. Sclera and conjunctiva red, and yellow/green drainage. Per patient eye matted shut this morning. Patient reports some blurred vision.

## 2015-05-22 NOTE — ED Provider Notes (Signed)
CSN: [id number]     Arrival date & time 05/22/15  1324 History   First MD Initiated Contact with Patient 05/22/15 1450     Chief Complaint  Patient presents with  . Eye Pain     (Consider location/radiation/quality/duration/timing/severity/associated sxs/prior Treatment) The history is provided by the patient.   [name] is a 30 y.o. female presenting with a foreign body sensation in her right eye.  She describes having an on again off again "floater" foreign body sensation in her right eye since yesterday, then woke this morning with her right eye red and irritated with  yellow to green matting.  She describes frequent intermittent symptoms just like this over the past several months.  She is never identified a foreign body in her eye.  She states it currently feels that it is beneath her upper eyelid.  She denies vision changes, she does not wear contacts or corrective lenses.  She has had no treatment prior to arrival for this condition.  She has had no exposures to pinkeye.     Past Medical History  Diagnosis Date  . Anxiety   . Asthma     no current med.  . Hand laceration involving tendon 12/13/2014    right  . Open metacarpal fracture 12/13/2014    right  . PTSD (post-traumatic stress disorder)    Past Surgical History  Procedure Laterality Date  . Cesarean section  03/02/2013  . Wisdom tooth extraction    . Dilation and curettage of uterus  01/12/2006    suction D & C  . Tubal ligation    . Bone tumor excision Left     great toe  . I&d extremity Right 12/17/2014    Procedure: RIGHT HAND IRRIGATION AND DEBRIDEMENT,;  Surgeon: Betha LoaKevin Kuzma, MD;  Location: [facility];  Service: Orthopedics;  Laterality: Right;  . Repair extensor tendon Right 12/17/2014    Procedure: REPAIR EXTENSOR TENDON;  Surgeon: Betha LoaKevin Kuzma, MD;  Location:  SURGERY CENTER;  Service: Orthopedics;  Laterality: Right;   Family History  Problem Relation Age of Onset  .  Hypertension Father    Social History  Substance Use Topics  . Smoking status: Former Smoker -- 0.00 packs/day for 0 years    Types: Cigarettes    Quit date: 02/12/2006  . Smokeless tobacco: Never Used  . Alcohol Use: 0.0 oz/week    0 Standard drinks or equivalent per week     Comment: occasionally   OB History    Gravida Para Term Preterm AB TAB SAB Ectopic Multiple Living   4 2 2  1  1   3      Review of Systems  Constitutional: Negative for fever and chills.  HENT: Negative for congestion and sore throat.   Eyes: Positive for pain, discharge and redness. Negative for photophobia and visual disturbance.  Respiratory: Negative for chest tightness and shortness of breath.   Cardiovascular: Negative.   Gastrointestinal: Negative.  Negative for nausea.  Musculoskeletal: Negative.  Negative for joint swelling and neck pain.  Skin: Negative.  Negative for rash and wound.  Neurological: Negative for dizziness, weakness, light-headedness and headaches.  Psychiatric/Behavioral: Negative.       Allergies  Prednisone and Latex  Home Medications   Prior to Admission medications   Medication Sig Start Date End Date Taking? Authorizing Provider  amphetamine-dextroamphetamine (ADDERALL XR) 15 MG 24 hr capsule Take 1 capsule by mouth daily. 01/05/15  Yes Beau FannyJohn C Withrow, FNP  Apple Cider Vinegar 600 MG CAPS Take 1 capsule by mouth daily.   Yes Historical Provider, MD  ARIPiprazole (ABILIFY) 2 MG tablet Take 1 tablet (2 mg total) by mouth daily. 01/05/15  Yes Beau Fanny, FNP  FLUoxetine (PROZAC) 40 MG capsule Take 1 capsule (40 mg total) by mouth daily. 01/05/15  Yes Beau Fanny, FNP  LORazepam (ATIVAN) 0.5 MG tablet Take 1 tablet (0.5 mg total) by mouth at bedtime as needed for anxiety. 01/05/15  Yes Beau Fanny, FNP  TURMERIC PO Take 1 tablet by mouth daily.   Yes Historical Provider, MD  vitamin B-12 (CYANOCOBALAMIN) 1000 MCG tablet Take 1,000 mcg by mouth daily.   Yes  Historical Provider, MD  vitamin C (ASCORBIC ACID) 500 MG tablet Take 500 mg by mouth daily.   Yes Historical Provider, MD  zolpidem (AMBIEN) 5 MG tablet Take 1 tablet (5 mg total) by mouth at bedtime. 01/05/15  Yes Beau Fanny, FNP  hydrOXYzine (ATARAX/VISTARIL) 25 MG tablet Take 1 tablet (25 mg total) by mouth every 6 (six) hours as needed for anxiety. Patient not taking: Reported on 05/22/2015 01/05/15   Beau Fanny, FNP  mirtazapine (REMERON) 15 MG tablet Take 1 tablet (15 mg total) by mouth at bedtime. Patient not taking: Reported on 05/22/2015 01/05/15   Beau Fanny, FNP  oxyCODONE-acetaminophen (PERCOCET/ROXICET) 5-325 MG tablet Take 1 tablet by mouth every 6 (six) hours as needed for severe pain. Patient not taking: Reported on 05/22/2015 01/05/15   Beau Fanny, FNP   BP 126/78 mmHg  Pulse 96  Temp(Src) 98.8 F (37.1 C) (Oral)  Resp 16  Ht  (1.6 m)  Wt 69.4 kg  BMI 27.11 kg/m2  SpO2 100%  LMP 05/22/2015 Physical Exam  Constitutional: She is oriented to person, place, and time. She appears well-developed and well-nourished.  HENT:  Head: Normocephalic and atraumatic.  Right Ear: Tympanic membrane and ear canal normal.  Left Ear: Tympanic membrane and ear canal normal.  Nose: Rhinorrhea present. No mucosal edema.  Mouth/Throat: Uvula is midline, oropharynx is clear and moist and mucous membranes are normal. No oropharyngeal exudate, posterior oropharyngeal edema, posterior oropharyngeal erythema or tonsillar abscesses.  Eyes: EOM and lids are normal. Pupils are equal, round, and reactive to light. Lids are everted and swept, no foreign bodies found. Right eye exhibits no exudate and no hordeolum. No foreign body present in the right eye. Right conjunctiva is injected.  Slit lamp exam:      The right eye shows no corneal abrasion, no corneal flare, no corneal ulcer, no foreign body, no hyphema, no fluorescein uptake and no anterior chamber bulge.  Visual Acuity -  Bilateral Near: 20/10 ; R Near: 20/15 ; L Near: 20/10  IOP measured x 3 - 3,4 and 4, resp.  Left eye normal.  Cardiovascular: Normal rate.   Pulmonary/Chest: Effort normal. No respiratory distress.  Musculoskeletal: Normal range of motion.  Neurological: She is alert and oriented to person, place, and time.  Skin: Skin is warm and dry. No rash noted.  Psychiatric: She has a normal mood and affect.    ED Course  Procedures (including critical care time) Labs Review Labs Reviewed - No data to display  Imaging Review No results found. I have personally reviewed and evaluated these images and lab results as part of my medical decision-making.   EKG Interpretation None      MDM   Final diagnoses:  Conjunctivitis of right eye  Patient was placed on Tobrex, advised to treat both eyes.  She was also given ketorolac for anti-inflammatory use and right eye.  There is no identified foreign body, no corneal abrasion.  Given the seemingly chronic intermittent nature of this problem, I referred her to Dr. Bing Plume for further evaluation if her symptoms persist.    The patient appears reasonably screened and/or stabilized for discharge and I doubt any other medical condition or other [facility] requiring further screening, evaluation, or treatment in the ED at this time prior to discharge.    Burgess Amor, PA-C 05/22/15 1634  Donnetta Hutching, MD 05/22/15 [phone]

## 2015-05-22 NOTE — Discharge Instructions (Signed)
Bacterial Conjunctivitis Bacterial conjunctivitis, commonly called pink eye, is an inflammation of the clear membrane that covers the white part of the eye (conjunctiva). The inflammation can also happen on the underside of the eyelids. The blood vessels in the conjunctiva become inflamed, causing the eye to become red or pink. Bacterial conjunctivitis may spread easily from one eye to another and from person to person (contagious).  CAUSES  Bacterial conjunctivitis is caused by bacteria. The bacteria may come from your own skin, your upper respiratory tract, or from someone else with bacterial conjunctivitis. SYMPTOMS  The normally white color of the eye or the underside of the eyelid is usually pink or red. The pink eye is usually associated with irritation, tearing, and some sensitivity to light. Bacterial conjunctivitis is often associated with a thick, yellowish discharge from the eye. The discharge may turn into a crust on the eyelids overnight, which causes your eyelids to stick together. If a discharge is present, there may also be some blurred vision in the affected eye. DIAGNOSIS  Bacterial conjunctivitis is diagnosed by your caregiver through an eye exam and the symptoms that you report. Your caregiver looks for changes in the surface tissues of your eyes, which may point to the specific type of conjunctivitis. A sample of any discharge may be collected on a cotton-tip swab if you have a severe case of conjunctivitis, if your cornea is affected, or if you keep getting repeat infections that do not respond to treatment. The sample will be sent to a lab to see if the inflammation is caused by a bacterial infection and to see if the infection will respond to antibiotic medicines. TREATMENT   Bacterial conjunctivitis is treated with antibiotics. Antibiotic eyedrops are most often used. However, antibiotic ointments are also available. Antibiotics pills are sometimes used. Artificial tears or eye  washes may ease discomfort. HOME CARE INSTRUCTIONS   To ease discomfort, apply a cool, clean washcloth to your eye for 10-20 minutes, 3-4 times a day.  Gently wipe away any drainage from your eye with a warm, wet washcloth or a cotton ball.  Wash your hands often with soap and water. Use paper towels to dry your hands.  Do not share towels or washcloths. This may spread the infection.  Change or wash your pillowcase every day.  You should not use eye makeup until the infection is gone.  Do not operate machinery or drive if your vision is blurred.  Stop using contact lenses. Ask your caregiver how to sterilize or replace your contacts before using them again. This depends on the type of contact lenses that you use.  When applying medicine to the infected eye, do not touch the edge of your eyelid with the eyedrop bottle or ointment tube. SEEK IMMEDIATE MEDICAL CARE IF:   Your infection has not improved within 3 days after beginning treatment.  You had yellow discharge from your eye and it returns.  You have increased eye pain.  Your eye redness is spreading.  Your vision becomes blurred.  You have a fever or persistent symptoms for more than 2-3 days.  You have a fever and your symptoms suddenly get worse.  You have facial pain, redness, or swelling. MAKE SURE YOU:   Understand these instructions.  Will watch your condition.  Will get help right away if you are not doing well or get worse.   This information is not intended to replace advice given to you by your health care provider. Make sure you   discuss any questions you have with your health care provider.   Document Released: 01/30/2005 Document Revised: 02/20/2014 Document Reviewed: 07/03/2011 Elsevier Interactive Patient Education 2016 [employer]   Apply one drop of the ketorolac in your right eye every 6 hours as needed for pain and redness.  Apply one drop of the tobrex in each eye (which is an antibiotic)  every 4 hours while awake for the next 5 days.  Call Dr. Lita MainsHaines for further management if your symptoms persist beyond the next 5 days.

## 2015-05-25 ENCOUNTER — Encounter: Payer: Self-pay | Admitting: Occupational Therapy

## 2015-05-25 NOTE — Therapy (Signed)
[facility] [address] [place], [place], [address] Phone: [phone]   Fax:  [phone]  Patient Details  Name: [name] MRN: [id number] Date of Birth: [birth year] Referring Provider: Dr. Betha LoaKevin Kuzma  Encounter Date: 05/25/2015  Pt did not return after initial O.T. Evaluation and splinting on 12/24/14. Will d/c episode of care at this time  [name], [name], OTR/L 05/25/2015, 12:57 PM  [facility] [address] [place], [place], [address] Phone: [phone]   Fax:  [phone]

## 2016-03-20 ENCOUNTER — Encounter (HOSPITAL_COMMUNITY): Payer: Self-pay | Admitting: Emergency Medicine

## 2016-03-20 ENCOUNTER — Emergency Department (HOSPITAL_COMMUNITY)
Admission: EM | Admit: 2016-03-20 | Discharge: 2016-03-20 | Disposition: A | Discharge status: Home / Self Care | Payer: Medicaid Other | Attending: Emergency Medicine | Admitting: Emergency Medicine

## 2016-03-20 ENCOUNTER — Emergency Department (HOSPITAL_COMMUNITY): Payer: Medicaid Other

## 2016-03-20 DIAGNOSIS — W230XXA Caught, crushed, jammed, or pinched between moving objects, initial encounter: Secondary | ICD-10-CM | POA: Diagnosis not present

## 2016-03-20 DIAGNOSIS — S92351A Displaced fracture of fifth metatarsal bone, right foot, initial encounter for closed fracture: Secondary | ICD-10-CM | POA: Insufficient documentation

## 2016-03-20 DIAGNOSIS — J45909 Unspecified asthma, uncomplicated: Secondary | ICD-10-CM | POA: Diagnosis not present

## 2016-03-20 DIAGNOSIS — S99921A Unspecified injury of right foot, initial encounter: Secondary | ICD-10-CM | POA: Diagnosis present

## 2016-03-20 DIAGNOSIS — Z87891 Personal history of nicotine dependence: Secondary | ICD-10-CM | POA: Insufficient documentation

## 2016-03-20 DIAGNOSIS — Y929 Unspecified place or not applicable: Secondary | ICD-10-CM | POA: Insufficient documentation

## 2016-03-20 DIAGNOSIS — S99101A Unspecified physeal fracture of right metatarsal, initial encounter for closed fracture: Secondary | ICD-10-CM

## 2016-03-20 DIAGNOSIS — Z79899 Other long term (current) drug therapy: Secondary | ICD-10-CM | POA: Diagnosis not present

## 2016-03-20 DIAGNOSIS — Y999 Unspecified external cause status: Secondary | ICD-10-CM | POA: Insufficient documentation

## 2016-03-20 DIAGNOSIS — Y939 Activity, unspecified: Secondary | ICD-10-CM | POA: Diagnosis not present

## 2016-03-20 NOTE — ED Triage Notes (Signed)
2 nights ago pt right foot was slammed in car door, pain and swelling

## 2016-03-20 NOTE — Discharge Instructions (Signed)
Return if any problems.  Schedule to see Dr. Harrison for evaluation  °

## 2016-03-20 NOTE — ED Provider Notes (Signed)
[facility]-EMERGENCY DEPT Provider Note   CSN: [id number] Arrival date & time: 03/20/16  1344   By signing my name below, I, [name], attest that this documentation has been prepared under the direction and in the presence of Ok Edwards, PA-C Electronically Signed: Cynda Powell, Scribe. 03/20/16. 2:40 PM.  History   Chief Complaint Chief Complaint  Patient presents with  . Foot Injury    HPI Comments: [name] is a 31 y.o. female with a hx of asthma, who presents to the Emergency Department complaining of a sudden-onset right foot injury that happened two days ago. Patient states she was intoxicated, when she slammed her right foot in the car door two days ago. Patient has associated pain and swelling. No modifying factors indicated. Patient denies any other symptoms.   The history is provided by the patient. No language interpreter was used.    Past Medical History:  Diagnosis Date  . Anxiety   . Asthma    no current med.  . Hand laceration involving tendon 12/13/2014   right  . Open metacarpal fracture 12/13/2014   right  . PTSD (post-traumatic stress disorder)     Patient Active Problem List   Diagnosis Date Noted  . Major depressive disorder, single episode, severe, without mention of psychotic behavior   . PTSD (post-traumatic stress disorder) 12/30/2014  . 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 Alcohol use disorder, moderate, dependence (HCC) 12/29/2014  . UTI (lower urinary tract infection) 07/21/2014  . Hypokalemia 07/21/2014  . Hyperglycemia 07/21/2014  . Pyelonephritis 07/21/2014    Past Surgical History:  Procedure Laterality Date  . BONE TUMOR EXCISION Left    great toe  . CESAREAN SECTION  03/02/2013  . DILATION AND CURETTAGE OF UTERUS  01/12/2006   suction D & C  . HAND SURGERY     right  . I&D EXTREMITY Right 12/17/2014   Procedure: RIGHT HAND IRRIGATION AND DEBRIDEMENT,;  Surgeon: Betha Loa, MD;   Location: [facility];  Service: Orthopedics;  Laterality: Right;  . REPAIR EXTENSOR TENDON Right 12/17/2014   Procedure: REPAIR EXTENSOR TENDON;  Surgeon: Betha Loa, MD;  Location: [facility];  Service: Orthopedics;  Laterality: Right;  . TUBAL LIGATION    . WISDOM TOOTH EXTRACTION      OB History    Gravida Para Term Preterm AB Living   4 2 2   1 3    SAB TAB Ectopic Multiple Live Births   1       2       Home Medications    Prior to Admission medications   Medication Sig Start Date End Date Taking? Authorizing Provider  amphetamine-dextroamphetamine (ADDERALL XR) 15 MG 24 hr capsule Take 1 capsule by mouth daily. 01/05/15   Beau Fanny, FNP  Apple Cider Vinegar 600 MG CAPS Take 1 capsule by mouth daily.    Historical Provider, MD  ARIPiprazole (ABILIFY) 2 MG tablet Take 1 tablet (2 mg total) by mouth daily. 01/05/15   Beau Fanny, FNP  FLUoxetine (PROZAC) 40 MG capsule Take 1 capsule (40 mg total) by mouth daily. 01/05/15   Beau Fanny, FNP  hydrOXYzine (ATARAX/VISTARIL) 25 MG tablet Take 1 tablet (25 mg total) by mouth every 6 (six) hours as needed for anxiety. Patient not taking: Reported on 05/22/2015 01/05/15   Beau Fanny, FNP  LORazepam (ATIVAN) 0.5 MG tablet Take 1 tablet (0.5 mg total) by mouth at bedtime  as needed for anxiety. 01/05/15   Beau FannyJohn C Withrow, FNP  mirtazapine (REMERON) 15 MG tablet Take 1 tablet (15 mg total) by mouth at bedtime. Patient not taking: Reported on 05/22/2015 01/05/15   Beau FannyJohn C Withrow, FNP  oxyCODONE-acetaminophen (PERCOCET/ROXICET) 5-325 MG tablet Take 1 tablet by mouth every 6 (six) hours as needed for severe pain. Patient not taking: Reported on 05/22/2015 01/05/15   Beau FannyJohn C Withrow, FNP  TURMERIC PO Take 1 tablet by mouth daily.    Historical Provider, MD  vitamin B-12 (CYANOCOBALAMIN) 1000 MCG tablet Take 1,000 mcg by mouth daily.    Historical Provider, MD  vitamin C (ASCORBIC ACID) 500 MG tablet Take 500 mg  by mouth daily.    Historical Provider, MD  zolpidem (AMBIEN) 5 MG tablet Take 1 tablet (5 mg total) by mouth at bedtime. 01/05/15   Beau FannyJohn C Withrow, FNP    Family History Family History  Problem Relation Age of Onset  . Hypertension Father     Social History Social History  Substance Use Topics  . Smoking status: Former Smoker    Packs/day: 0.00    Years: 0.00    Types: Cigarettes    Quit date: 02/12/2006  . Smokeless tobacco: Never Used  . Alcohol use 0.0 oz/week     Comment: occasionally     Allergies   Prednisone and Latex   Review of Systems Review of Systems  Musculoskeletal: Positive for arthralgias (Right foot).  Skin: Positive for color change.  All other systems reviewed and are negative.    Physical Exam Updated Vital Signs BP 149/90 (BP Location: Left Arm)   Pulse 112   Temp 98.1 F (36.7 C) (Oral)   Resp 18   Ht 5\' 3"  (1.6 m)   Wt 150 lb (68 kg)   LMP 03/02/2016   SpO2 98%   BMI 26.57 kg/m   Physical Exam  Constitutional: She is oriented to person, place, and time. She appears well-developed.  HENT:  Head: Normocephalic and atraumatic.  Mouth/Throat: Oropharynx is clear and moist.  Eyes: Conjunctivae and EOM are normal. Pupils are equal, round, and reactive to light.  Neck: Normal range of motion. Neck supple.  Cardiovascular: Normal rate.   Pulmonary/Chest: Effort normal.  Abdominal: Soft. Bowel sounds are normal.  Musculoskeletal: Normal range of motion.  Ecchymosis and edema to the right anterior foot. Patient has pain with range of movement.   Neurological: She is alert and oriented to person, place, and time.  Skin: Skin is warm and dry.     ED Treatments / Results  DIAGNOSTIC STUDIES: Oxygen Saturation is 98% on RA, normal by my interpretation.    COORDINATION OF CARE: 2:40 PM Discussed treatment plan with pt at bedside and pt agreed to plan.  Labs (all labs ordered are listed, but only abnormal results are displayed) Labs  Reviewed - No data to display  EKG  EKG Interpretation None       Radiology No results found.  Procedures Procedures (including critical care time)  Medications Ordered in ED Medications - No data to display   Initial Impression / Assessment and Plan / ED Course  I have reviewed the triage vital signs and the nursing notes.  Pertinent labs & imaging results that were available during my care of the patient were reviewed by me and considered in my medical decision making (see chart for details).       Final Clinical Impressions(s) / ED Diagnoses   Final diagnoses:  Closed  physeal fracture of fifth metatarsal bone of right foot, unspecified physeal fracture configuration, initial encounter    New Prescriptions Discharge Medication List as of 03/20/2016  3:01 PM     No outpatient prescriptions have been marked as taking for the 03/20/16 encounter [facility] Encounter).  Pt placed in ace wrap and post op shoe.   An After Visit Summary was printed and given to the patient.   Lonia Skinner [place], PA-C 03/20/16 1618    Jacalyn Lefevre, MD 03/20/16 [phone]

## 2016-06-25 ENCOUNTER — Encounter (HOSPITAL_COMMUNITY): Payer: Self-pay | Admitting: Emergency Medicine

## 2016-06-25 ENCOUNTER — Emergency Department (HOSPITAL_COMMUNITY)
Admission: EM | Admit: 2016-06-25 | Discharge: 2016-06-26 | Payer: Medicaid Other | Attending: Emergency Medicine | Admitting: Emergency Medicine

## 2016-06-25 DIAGNOSIS — Y939 Activity, unspecified: Secondary | ICD-10-CM | POA: Insufficient documentation

## 2016-06-25 DIAGNOSIS — Z789 Other specified health status: Secondary | ICD-10-CM

## 2016-06-25 DIAGNOSIS — T07XXXA Unspecified multiple injuries, initial encounter: Secondary | ICD-10-CM

## 2016-06-25 DIAGNOSIS — Z87891 Personal history of nicotine dependence: Secondary | ICD-10-CM | POA: Diagnosis not present

## 2016-06-25 DIAGNOSIS — Y999 Unspecified external cause status: Secondary | ICD-10-CM | POA: Insufficient documentation

## 2016-06-25 DIAGNOSIS — X788XXA Intentional self-harm by other sharp object, initial encounter: Secondary | ICD-10-CM | POA: Insufficient documentation

## 2016-06-25 DIAGNOSIS — Z7289 Other problems related to lifestyle: Secondary | ICD-10-CM

## 2016-06-25 DIAGNOSIS — Z79899 Other long term (current) drug therapy: Secondary | ICD-10-CM | POA: Insufficient documentation

## 2016-06-25 DIAGNOSIS — J45909 Unspecified asthma, uncomplicated: Secondary | ICD-10-CM | POA: Diagnosis not present

## 2016-06-25 DIAGNOSIS — Y929 Unspecified place or not applicable: Secondary | ICD-10-CM | POA: Diagnosis not present

## 2016-06-25 DIAGNOSIS — S61512A Laceration without foreign body of left wrist, initial encounter: Secondary | ICD-10-CM | POA: Diagnosis not present

## 2016-06-25 DIAGNOSIS — Z23 Encounter for immunization: Secondary | ICD-10-CM | POA: Insufficient documentation

## 2016-06-25 DIAGNOSIS — F10920 Alcohol use, unspecified with intoxication, uncomplicated: Secondary | ICD-10-CM | POA: Insufficient documentation

## 2016-06-25 DIAGNOSIS — Z046 Encounter for general psychiatric examination, requested by authority: Secondary | ICD-10-CM | POA: Diagnosis present

## 2016-06-25 LAB — CBC WITH DIFFERENTIAL/PLATELET
Basophils Absolute: 0 10*3/uL (ref 0.0–0.1)
Basophils Relative: 0 %
Eosinophils Absolute: 0.1 10*3/uL (ref 0.0–0.7)
Eosinophils Relative: 1 %
HCT: 42.1 % (ref 36.0–46.0)
Hemoglobin: 14 g/dL (ref 12.0–15.0)
Lymphocytes Relative: 22 %
Lymphs Abs: 1.6 10*3/uL (ref 0.7–4.0)
MCH: 29.9 pg (ref 26.0–34.0)
MCHC: 33.3 g/dL (ref 30.0–36.0)
MCV: 89.8 fL (ref 78.0–100.0)
Monocytes Absolute: 0.5 10*3/uL (ref 0.1–1.0)
Monocytes Relative: 8 %
Neutro Abs: 4.9 10*3/uL (ref 1.7–7.7)
Neutrophils Relative %: 69 %
Platelets: 210 10*3/uL (ref 150–400)
RBC: 4.69 MIL/uL (ref 3.87–5.11)
RDW: 13.4 % (ref 11.5–15.5)
WBC: 7.1 10*3/uL (ref 4.0–10.5)

## 2016-06-25 LAB — BASIC METABOLIC PANEL
Anion gap: 10 (ref 5–15)
BUN: 12 mg/dL (ref 6–20)
CO2: 21 mmol/L — ABNORMAL LOW (ref 22–32)
Calcium: 8.9 mg/dL (ref 8.9–10.3)
Chloride: 107 mmol/L (ref 101–111)
Creatinine, Ser: 0.75 mg/dL (ref 0.44–1.00)
GFR calc Af Amer: >60 mL/min (ref >60)
GFR calc non Af Amer: >60 mL/min (ref >60)
Glucose, Bld: 97 mg/dL (ref 65–99)
Potassium: 3.2 mmol/L — ABNORMAL LOW (ref 3.5–5.1)
Sodium: 138 mmol/L (ref 135–145)

## 2016-06-25 LAB — RAPID URINE DRUG SCREEN, HOSP PERFORMED
Amphetamines: NOT DETECTED
Barbiturates: NOT DETECTED
Benzodiazepines: POSITIVE — AB
Cocaine: NOT DETECTED
Opiates: NOT DETECTED
Tetrahydrocannabinol: POSITIVE — AB

## 2016-06-25 LAB — ETHANOL: Alcohol, Ethyl (B): 84 mg/dL — ABNORMAL HIGH (ref <5)

## 2016-06-25 LAB — PREGNANCY, URINE: Preg Test, Ur: NEGATIVE

## 2016-06-25 MED ORDER — ACETAMINOPHEN 325 MG PO TABS: 650.0000 mg | ORAL_TABLET | ORAL | Status: DC | PRN

## 2016-06-25 MED ORDER — POTASSIUM CHLORIDE CRYS ER 20 MEQ PO TBCR
40.0000 meq | EXTENDED_RELEASE_TABLET | Freq: Once | ORAL | Status: AC
  Administered 2016-06-25: 40 meq via ORAL
  Filled 2016-06-25: qty 2

## 2016-06-25 MED ORDER — TETANUS-DIPHTH-ACELL PERTUSSIS 5-2.5-18.5 LF-MCG/0.5 IM SUSP
0.5000 mL | Freq: Once | INTRAMUSCULAR | Status: AC
  Administered 2016-06-25: 0.5 mL via INTRAMUSCULAR
  Filled 2016-06-25: qty 0.5

## 2016-06-25 MED ORDER — IBUPROFEN 400 MG PO TABS: 600.0000 mg | ORAL_TABLET | Freq: Three times a day (TID) | ORAL | Status: DC | PRN

## 2016-06-25 MED ORDER — ZOLPIDEM TARTRATE 5 MG PO TABS: 5.0000 mg | ORAL_TABLET | Freq: Every evening | ORAL | Status: DC | PRN

## 2016-06-25 MED ORDER — NICOTINE 21 MG/24HR TD PT24: 21.0000 mg | MEDICATED_PATCH | Freq: Every day | TRANSDERMAL | Status: DC | PRN

## 2016-06-25 NOTE — ED Provider Notes (Signed)
[facility]-EMERGENCY DEPT Provider Note   CSN: [id number] Arrival date & time: 06/25/16  2115     History   Chief Complaint Chief Complaint  Patient presents with  . V70.1    HPI [name] is a 31 y.o. female.  The history is provided by the patient and the police. The history is limited by the condition of the patient (agitated, intoxicated).    Pt was seen at 2210. Per Police and pt: c/o "having a bad day" and "posted stuff on social media." Pt states she also cut her left volar wrist with a razor blade. Pt states she does not see her mental health provider "because no one cares about anyone [place] [facility]."  Continues to state to Police that "she's going to post more stuff, just so you know."   Past Medical History:  Diagnosis Date  . Anxiety   . Asthma    no current med.  . Hand laceration involving tendon 12/13/2014   right  . Open metacarpal fracture 12/13/2014   right  . PTSD (post-traumatic stress disorder)     Patient Active Problem List   Diagnosis Date Noted  . Major depressive disorder, single episode, severe, without mention of psychotic behavior   . PTSD (post-traumatic stress disorder) 12/30/2014  . 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 Alcohol use disorder, moderate, dependence (HCC) 12/29/2014  . UTI (lower urinary tract infection) 07/21/2014  . Hypokalemia 07/21/2014  . Hyperglycemia 07/21/2014  . Pyelonephritis 07/21/2014    Past Surgical History:  Procedure Laterality Date  . BONE TUMOR EXCISION Left    great toe  . CESAREAN SECTION  03/02/2013  . DILATION AND CURETTAGE OF UTERUS  01/12/2006   suction D & C  . HAND SURGERY     right  . I&D EXTREMITY Right 12/17/2014   Procedure: RIGHT HAND IRRIGATION AND DEBRIDEMENT,;  Surgeon: Betha LoaKevin Kuzma, MD;  Location: [facility];  Service: Orthopedics;  Laterality: Right;  . REPAIR EXTENSOR TENDON Right 12/17/2014   Procedure: REPAIR EXTENSOR TENDON;   Surgeon: Betha LoaKevin Kuzma, MD;  Location: [facility];  Service: Orthopedics;  Laterality: Right;  . TUBAL LIGATION    . WISDOM TOOTH EXTRACTION      OB History    Gravida Para Term Preterm AB Living   4 2 2   1 3    SAB TAB Ectopic Multiple Live Births   1       2       Home Medications    Prior to Admission medications   Medication Sig Start Date End Date Taking? Authorizing Provider  albuterol (PROVENTIL HFA;VENTOLIN HFA) 108 (90 Base) MCG/ACT inhaler Inhale 1-2 puffs into the lungs every 6 (six) hours as needed for wheezing or shortness of breath.  03/10/16 03/10/17 Yes [provider]  amphetamine-dextroamphetamine (ADDERALL) 10 MG tablet Take 10 mg by mouth daily with breakfast.   Yes [provider]  Apple Cider Vinegar 600 MG CAPS Take 1 capsule by mouth daily.   Yes [provider]  co-enzyme Q-10 30 MG capsule Take 30 mg by mouth 3 (three) times daily.   Yes [provider]  Cyanocobalamin (B-12 PO) Take 1 tablet by mouth daily.   Yes [provider]  LORazepam (ATIVAN) 0.5 MG tablet Take 1 tablet (0.5 mg total) by mouth at bedtime as needed for anxiety. 01/05/15  Yes Withrow, Everardo AllJohn C, FNP  Pyridoxine HCl (B-6 PO) Take 1 tablet by mouth  daily.   Yes [provider]  TURMERIC PO Take 1 tablet by mouth daily.   Yes [provider]  vitamin C (ASCORBIC ACID) 500 MG tablet Take 500 mg by mouth daily.   Yes [provider]  zolpidem (AMBIEN) 5 MG tablet Take 1 tablet (5 mg total) by mouth at bedtime. 01/05/15  Yes Withrow, Everardo All, FNP  FLUoxetine (PROZAC) 40 MG capsule Take 1 capsule (40 mg total) by mouth daily. Patient not taking: Reported on 06/25/2016 01/05/15   Beau Fanny, FNP    Family History Family History  Problem Relation Age of Onset  . Hypertension Father     Social History Social History  Substance Use Topics  . Smoking status: Former Smoker    Packs/day: 0.00    Years: 0.00      Types: Cigarettes    Quit date: 02/12/2006  . Smokeless tobacco: Never Used  . Alcohol use 0.0 oz/week     Comment: occasionally     Allergies   Prednisone; Hydrocodone-acetaminophen; and Latex   Review of Systems Review of Systems  Unable to perform ROS: Psychiatric disorder     Physical Exam Updated Vital Signs BP (!) 143/87   Pulse (!) 104   Temp 98.5 F (36.9 C)   Resp 18   Ht 5\' 3"  (1.6 m)   Wt 150 lb (68 kg)   LMP 05/26/2016   SpO2 96%   BMI 26.57 kg/m   Physical Exam 2215: Physical examination:  Nursing notes reviewed; Vital signs and O2 SAT reviewed;  Constitutional: Well developed, Well nourished, Well hydrated, In no acute distress; Head:  Normocephalic, atraumatic; Eyes: EOMI, PERRL, No scleral icterus; ENMT: Mouth and pharynx normal, Mucous membranes moist; Neck: Supple, Full range of motion; Cardiovascular: Regular rate and rhythm; Respiratory: Breath sounds clear, No wheezes.  Speaking full sentences with ease, Normal respiratory effort/excursion; Chest: No deformity, Movement normal; Abdomen: Nondistended; Extremities: No deformity. +multiple superficial linear abrasions left volar forearm.; Neuro: AA&Ox3, Major CN grossly intact.  Speech clear. No gross focal motor deficits in extremities. Climbs on and off stretcher easily by herself. Gait steady.; Skin: Color normal, Warm, Dry.; Psych:  Affect flat.    ED Treatments / Results  Labs (all labs ordered are listed, but only abnormal results are displayed)   EKG  EKG Interpretation None       Radiology   Procedures Procedures (including critical care time)  Medications Ordered in ED Medications  nicotine (NICODERM CQ - dosed in mg/24 hours) patch 21 mg (not administered)  zolpidem (AMBIEN) tablet 5 mg (not administered)  ibuprofen (ADVIL,MOTRIN) tablet 600 mg (not administered)  acetaminophen (TYLENOL) tablet 650 mg (not administered)  potassium chloride SA (K-DUR,KLOR-CON) CR tablet 40  mEq (40 mEq Oral Given 06/25/16 2314)  Tdap (BOOSTRIX) injection 0.5 mL (0.5 mLs Intramuscular Given 06/25/16 2314)     Initial Impression / Assessment and Plan / ED Course  I have reviewed the triage vital signs and the nursing notes.  Pertinent labs & imaging results that were available during my care of the patient were reviewed by me and considered in my medical decision making (see chart for details).  MDM Reviewed: previous chart, nursing note and vitals Reviewed previous: labs Interpretation: labs    Results for orders placed or performed during the hospital encounter of 06/25/16  Ethanol  Result Value Ref Range   Alcohol, Ethyl (B) 84 (H) <5 mg/dL  Basic metabolic panel  Result Value Ref Range  Sodium 138 135 - 145 mmol/L   Potassium 3.2 (L) 3.5 - 5.1 mmol/L   Chloride 107 101 - 111 mmol/L   CO2 21 (L) 22 - 32 mmol/L   Glucose, Bld 97 65 - 99 mg/dL   BUN 12 6 - 20 mg/dL   Creatinine, Ser 9.60 0.44 - 1.00 mg/dL   Calcium 8.9 8.9 - 45.4 mg/dL   GFR calc non Af Amer >60 >60 mL/min   GFR calc Af Amer >60 >60 mL/min   Anion gap 10 5 - 15  CBC with Differential  Result Value Ref Range   WBC 7.1 4.0 - 10.5 K/uL   RBC 4.69 3.87 - 5.11 MIL/uL   Hemoglobin 14.0 12.0 - 15.0 g/dL   HCT 09.8 11.9 - 14.7 %   MCV 89.8 78.0 - 100.0 fL   MCH 29.9 26.0 - 34.0 pg   MCHC 33.3 30.0 - 36.0 g/dL   RDW 82.9 56.2 - 13.0 %   Platelets 210 150 - 400 K/uL   Neutrophils Relative % 69 %   Neutro Abs 4.9 1.7 - 7.7 K/uL   Lymphocytes Relative 22 %   Lymphs Abs 1.6 0.7 - 4.0 K/uL   Monocytes Relative 8 %   Monocytes Absolute 0.5 0.1 - 1.0 K/uL   Eosinophils Relative 1 %   Eosinophils Absolute 0.1 0.0 - 0.7 K/uL   Basophils Relative 0 %   Basophils Absolute 0.0 0.0 - 0.1 K/uL  Urine rapid drug screen (hosp performed)  Result Value Ref Range   Opiates NONE DETECTED NONE DETECTED   Cocaine NONE DETECTED NONE DETECTED   Benzodiazepines POSITIVE (A) NONE DETECTED   Amphetamines NONE  DETECTED NONE DETECTED   Tetrahydrocannabinol POSITIVE (A) NONE DETECTED   Barbiturates NONE DETECTED NONE DETECTED  Pregnancy, urine  Result Value Ref Range   Preg Test, Ur NEGATIVE NEGATIVE    2325:  Will have TTS evaluate. Potassium repleted PO. Holding orders written.    Final Clinical Impressions(s) / ED Diagnoses   Final diagnoses:  None    New Prescriptions New Prescriptions   No medications on file      Samuel Jester, DO 06/25/16 2328
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
## 2016-06-25 NOTE — ED Notes (Signed)
Patient placed into burgundy scrubs. Belongings collected and secured in locker.

## 2016-06-25 NOTE — ED Notes (Signed)
Patient has superficial cuts noted to left wrist.  Wounds cleaned and covered with dressing.
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
## 2016-06-25 NOTE — Progress Notes (Signed)
Per Nira ConnJason Berry, NP meets inpatient criteria Ekaterina Denise K. Sherlon HandingHarris, LCAS-A, LPC-A, [facility]  Counselor 06/25/2016 11:52 PM

## 2016-06-25 NOTE — ED Notes (Signed)
TTS in progress at this time.  

## 2016-06-26 ENCOUNTER — Inpatient Hospital Stay (HOSPITAL_COMMUNITY)
Admission: AD | Admit: 2016-06-26 | Discharge: 2016-06-30 | DRG: 897 | Disposition: A | Discharge status: Home / Self Care | Payer: Medicaid Other | Source: Intra-hospital | Attending: Psychiatry | Admitting: Psychiatry

## 2016-06-26 ENCOUNTER — Encounter (HOSPITAL_COMMUNITY): Payer: Self-pay | Admitting: *Deleted

## 2016-06-26 DIAGNOSIS — R45851 Suicidal ideations: Secondary | ICD-10-CM

## 2016-06-26 DIAGNOSIS — G4700 Insomnia, unspecified: Secondary | ICD-10-CM | POA: Diagnosis present

## 2016-06-26 DIAGNOSIS — F10229 Alcohol dependence with intoxication, unspecified: Secondary | ICD-10-CM | POA: Diagnosis present

## 2016-06-26 DIAGNOSIS — F1994 Other psychoactive substance use, unspecified with psychoactive substance-induced mood disorder: Secondary | ICD-10-CM | POA: Diagnosis not present

## 2016-06-26 DIAGNOSIS — F419 Anxiety disorder, unspecified: Secondary | ICD-10-CM | POA: Diagnosis present

## 2016-06-26 DIAGNOSIS — Z888 Allergy status to other drugs, medicaments and biological substances status: Secondary | ICD-10-CM | POA: Diagnosis not present

## 2016-06-26 DIAGNOSIS — Y904 Blood alcohol level of 80-99 mg/100 ml: Secondary | ICD-10-CM | POA: Diagnosis present

## 2016-06-26 DIAGNOSIS — Z91040 Latex allergy status: Secondary | ICD-10-CM

## 2016-06-26 DIAGNOSIS — F10920 Alcohol use, unspecified with intoxication, uncomplicated: Secondary | ICD-10-CM | POA: Diagnosis not present

## 2016-06-26 DIAGNOSIS — Z79899 Other long term (current) drug therapy: Secondary | ICD-10-CM | POA: Diagnosis not present

## 2016-06-26 DIAGNOSIS — Z915 Personal history of self-harm: Secondary | ICD-10-CM | POA: Diagnosis not present

## 2016-06-26 DIAGNOSIS — Z885 Allergy status to narcotic agent status: Secondary | ICD-10-CM

## 2016-06-26 DIAGNOSIS — F4310 Post-traumatic stress disorder, unspecified: Secondary | ICD-10-CM | POA: Diagnosis present

## 2016-06-26 DIAGNOSIS — F1290 Cannabis use, unspecified, uncomplicated: Secondary | ICD-10-CM | POA: Diagnosis not present

## 2016-06-26 DIAGNOSIS — J45909 Unspecified asthma, uncomplicated: Secondary | ICD-10-CM | POA: Diagnosis present

## 2016-06-26 DIAGNOSIS — F322 Major depressive disorder, single episode, severe without psychotic features: Secondary | ICD-10-CM | POA: Diagnosis present

## 2016-06-26 DIAGNOSIS — Z87891 Personal history of nicotine dependence: Secondary | ICD-10-CM

## 2016-06-26 DIAGNOSIS — F1920 Other psychoactive substance dependence, uncomplicated: Principal | ICD-10-CM | POA: Diagnosis present

## 2016-06-26 HISTORY — DX: Major depressive disorder, single episode, unspecified: F32.9

## 2016-06-26 HISTORY — DX: Headache: R51

## 2016-06-26 HISTORY — DX: Headache, unspecified: R51.9

## 2016-06-26 HISTORY — DX: Depression, unspecified: F32.A

## 2016-06-26 MED ORDER — ALBUTEROL SULFATE HFA 108 (90 BASE) MCG/ACT IN AERS: 1.0000 | INHALATION_SPRAY | Freq: Four times a day (QID) | RESPIRATORY_TRACT | Status: DC | PRN

## 2016-06-26 MED ORDER — LORAZEPAM 0.5 MG PO TABS
0.5000 mg | ORAL_TABLET | Freq: Once | ORAL | Status: AC
  Administered 2016-06-26: 0.5 mg via ORAL
  Filled 2016-06-26: qty 1

## 2016-06-26 MED ORDER — MAGNESIUM HYDROXIDE 400 MG/5ML PO SUSP: 30.0000 mL | Freq: Every day | ORAL | Status: DC | PRN

## 2016-06-26 MED ORDER — ADULT MULTIVITAMIN W/MINERALS CH
1.0000 | ORAL_TABLET | Freq: Every day | ORAL | Status: DC
  Administered 2016-06-27 – 2016-06-30 (×3): 1 via ORAL
  Filled 2016-06-26 (×6): qty 1

## 2016-06-26 MED ORDER — LORAZEPAM 1 MG PO TABS
1.0000 mg | ORAL_TABLET | Freq: Four times a day (QID) | ORAL | Status: AC
  Administered 2016-06-26 – 2016-06-27 (×4): 1 mg via ORAL
  Filled 2016-06-26 (×5): qty 1

## 2016-06-26 MED ORDER — ONDANSETRON 4 MG PO TBDP: 4.0000 mg | ORAL_TABLET | Freq: Four times a day (QID) | ORAL | Status: AC | PRN

## 2016-06-26 MED ORDER — TOPIRAMATE 25 MG PO TABS
25.0000 mg | ORAL_TABLET | Freq: Two times a day (BID) | ORAL | Status: DC
  Filled 2016-06-26 (×6): qty 1

## 2016-06-26 MED ORDER — OLANZAPINE 5 MG PO TABS
5.0000 mg | ORAL_TABLET | Freq: Every day | ORAL | Status: DC
  Filled 2016-06-26 (×3): qty 1

## 2016-06-26 MED ORDER — LORAZEPAM 1 MG PO TABS: 1.0000 mg | ORAL_TABLET | Freq: Every day | ORAL | Status: DC

## 2016-06-26 MED ORDER — HYDROXYZINE HCL 25 MG PO TABS
25.0000 mg | ORAL_TABLET | Freq: Four times a day (QID) | ORAL | Status: AC | PRN
  Administered 2016-06-29: 25 mg via ORAL
  Filled 2016-06-26: qty 1

## 2016-06-26 MED ORDER — DULOXETINE HCL 20 MG PO CPEP
20.0000 mg | ORAL_CAPSULE | Freq: Two times a day (BID) | ORAL | Status: DC
  Filled 2016-06-26 (×5): qty 1

## 2016-06-26 MED ORDER — LORAZEPAM 1 MG PO TABS
1.0000 mg | ORAL_TABLET | Freq: Two times a day (BID) | ORAL | Status: AC
  Administered 2016-06-29: 1 mg via ORAL
  Filled 2016-06-26: qty 1

## 2016-06-26 MED ORDER — LOPERAMIDE HCL 2 MG PO CAPS
2.0000 mg | ORAL_CAPSULE | ORAL | Status: AC | PRN
Start: 2016-06-26 — End: 2016-06-29

## 2016-06-26 MED ORDER — LORAZEPAM 1 MG PO TABS
1.0000 mg | ORAL_TABLET | Freq: Four times a day (QID) | ORAL | Status: AC | PRN
  Administered 2016-06-28: 1 mg via ORAL
  Filled 2016-06-26: qty 1

## 2016-06-26 MED ORDER — THIAMINE HCL 100 MG/ML IJ SOLN: 100.0000 mg | Freq: Once | INTRAMUSCULAR | Status: DC

## 2016-06-26 MED ORDER — FLUOXETINE HCL 40 MG PO CAPS: 20.0000 mg | ORAL_CAPSULE | Freq: Every day | ORAL | Status: DC

## 2016-06-26 MED ORDER — ALUM & MAG HYDROXIDE-SIMETH 200-200-20 MG/5ML PO SUSP: 30.0000 mL | ORAL | Status: DC | PRN

## 2016-06-26 MED ORDER — VITAMIN B-1 100 MG PO TABS
100.0000 mg | ORAL_TABLET | Freq: Every day | ORAL | Status: DC
  Administered 2016-06-27 – 2016-06-30 (×3): 100 mg via ORAL
  Filled 2016-06-26 (×7): qty 1

## 2016-06-26 MED ORDER — LORAZEPAM 1 MG PO TABS: 1.0000 mg | ORAL_TABLET | Freq: Three times a day (TID) | ORAL | Status: AC

## 2016-06-26 MED ORDER — HYDROXYZINE HCL 25 MG PO TABS
50.0000 mg | ORAL_TABLET | Freq: Once | ORAL | Status: AC
  Administered 2016-06-26: 50 mg via ORAL
  Filled 2016-06-26: qty 2

## 2016-06-26 NOTE — ED Notes (Signed)
[facility] at [facility]

## 2016-06-26 NOTE — ED Notes (Signed)
Pt removed bandages and cussing at staff; security notifed

## 2016-06-26 NOTE — ED Provider Notes (Signed)
TTS recommending inpatient placement. Patient is very upset. She states that she does not want to stay. She reports "I just had a bad day." I just discussed with the patient that given her suicidal gesture and threats on social media, we had to take her seriously. If she does not voluntarily agreed to stay, she will need to be involuntarily committed given concerns for self-harm. Patient is very upset and is cursing at me. IVC paperwork was filled out and held.   Shon BatonHorton, Courtney F, MD 06/26/16 [phone]

## 2016-06-26 NOTE — Progress Notes (Signed)
[facility], Laura ConnJason Berry, NP is the accepting provider and Dr. Jama Flavorsobos is the attending physician.  Call report to [phone].  Laura EulerJean T. Kaylyn LimSutter, MSW, LCSWA Clinical Social Work Disposition [phone]

## 2016-06-26 NOTE — BH Assessment (Signed)
[facility] Assessment Progress Note  Pt admits to making statements, saying, "I was just having a bad day". She states that she continues taking the medications prescribed to her by hr family doctor, but  stopped her Prozac two months ago, which she says makes her feel better. She states that there are "no counselors in [place] part [place] the county". She states that [facility].  Patient is dressed in scrubs and is alert and oriented x4. Patient speech was within normal limits and motor behavior appeared normal. Patient thought process is coherent. Patient does not appear to be responding to internal stimuli. Patient was cooperative throughout the assessment and states that  she understands [facility]. She points out that her cuts are very superficial.  TTS to continue to seek placement.

## 2016-06-26 NOTE — ED Notes (Addendum)
Pt requesting anxiety medication. Will make Ed physician aware

## 2016-06-26 NOTE — Progress Notes (Signed)
Referral submitted to: [place], [facility], [facility], [facility], [facility], [facility], [facility], [facility], [address] [place], [place], [facility], [facility], [facility] [place] K. Sherlon HandingHarris, LCAS-A, LPC-A, [facility]  Counselor 06/26/2016 2:58 AM

## 2016-06-27 DIAGNOSIS — R45851 Suicidal ideations: Secondary | ICD-10-CM

## 2016-06-27 DIAGNOSIS — F322 Major depressive disorder, single episode, severe without psychotic features: Secondary | ICD-10-CM

## 2016-06-27 DIAGNOSIS — F1920 Other psychoactive substance dependence, uncomplicated: Secondary | ICD-10-CM | POA: Diagnosis present

## 2016-06-27 DIAGNOSIS — F4310 Post-traumatic stress disorder, unspecified: Secondary | ICD-10-CM

## 2016-06-27 DIAGNOSIS — F1994 Other psychoactive substance use, unspecified with psychoactive substance-induced mood disorder: Secondary | ICD-10-CM

## 2016-06-27 MED ORDER — FLUOXETINE HCL 10 MG PO CAPS
10.0000 mg | ORAL_CAPSULE | Freq: Every day | ORAL | Status: DC
  Filled 2016-06-27 (×5): qty 1

## 2016-06-27 NOTE — Progress Notes (Signed)
DAR NOTE: Patient presents with anxious, angry affect and irritable mood.  Denies pain, auditory and visual hallucinations.  Described energy level as low and concentration as poor.  Reports withdrawal symptoms of agitation and irritability. Rates depression at 5, hopelessness at 7, and anxiety at 7.  Maintained on routine safety checks.  Medications given as prescribed.  Support and encouragement offered as needed.  Attended group and participated.  Patient observed socializing with peers in the dayroom.  Offered no complaint.
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
## 2016-06-27 NOTE — Tx Team (Signed)
Initial Treatment Plan 06/27/2016 12:00 AM [name] Geary WUJ:[id number]    PATIENT STRESSORS: Financial difficulties Marital or family conflict Medication change or noncompliance Substance abuse   PATIENT STRENGTHS: Average or above average intelligence Capable of independent living General fund of knowledge Supportive family/friends   PATIENT IDENTIFIED PROBLEMS: Depression/anxiety  Risk for self harm- cutting self  Alcohol abuse    " I need to get back on my depression medication"  "I need to learn how to deal with my feelings when I have bad things happen"           DISCHARGE CRITERIA:  Adequate post-discharge living arrangements Improved stabilization in mood, thinking, and/or behavior Motivation to continue treatment in a less acute level of care Need for constant or close observation no longer present Verbal commitment to aftercare and medication compliance Withdrawal symptoms are absent or subacute and managed without 24-hour nursing intervention  PRELIMINARY DISCHARGE PLAN: Attend aftercare/continuing care group Outpatient therapy Return to previous living arrangement  PATIENT/FAMILY INVOLVEMENT: This treatment plan has been presented to and reviewed with the patient, [name] Asche, and/or family member.  The patient and family have been given the opportunity to ask questions and make suggestions.  Charlott HollerSpeagle, Izacc Demeyer Church, RN 06/27/2016, 12:00 AM

## 2016-06-27 NOTE — Progress Notes (Signed)
Recreation Therapy Notes  Animal-Assisted Activity (AAA) Program Checklist/Progress Notes Patient Eligibility Criteria Checklist & Daily Group note for Rec TxIntervention  Date: 05.15.2018 Time: 2:45pm Location: [address] Dayroom    AAA/T Program Assumption of Risk Form signed by Patient/ or Parent Legal Guardian Yes  Patient is free of allergies or sever asthma Yes  Patient reports no fear of animals Yes  Patient reports no history of cruelty to animals Yes  Patient understands his/her participation is voluntary Yes  Behavioral Response: Did not attend.   Dajanae Brophy L Alanie Syler, LRT/CTRS       Teaghan Melrose L 06/27/2016 2:54 PM 

## 2016-06-27 NOTE — BHH Suicide Risk Assessment (Signed)
[facility] INPATIENT:  Family/Significant Other Suicide Prevention Education  Suicide Prevention Education:  Patient Refusal for Family/Significant Other Suicide Prevention Education: The patient [name] has refused to provide written consent for family/significant other to be provided Family/Significant Other Suicide Prevention Education during admission and/or prior to discharge.  Physician notified.  Verdene LennertLauren C Fancy Dunkley 06/27/2016, 10:27 AM

## 2016-06-27 NOTE — Progress Notes (Signed)
Pt was in bed at the beginning of the shift and remained there while evening group was in session.  She got up a short time afterwards and was heard talking on the phone in a "heated" manner.  When the call ended, pt was visibly angry.  Pt said she did not want to talk about the call.  She reports that she is not SI/HI/AVH, but is feeling very anxious.  She denies having any withdrawal symptoms tonight, but did receive her scheduled Ativan 1 mg per protocol.  Support and encouragement offered.  Pt makes her needs known to staff.  Discharge plans are in process.  Safety maintained with q15 minute checks.

## 2016-06-27 NOTE — Tx Team (Signed)
Interdisciplinary Treatment and Diagnostic Plan Update  06/27/2016 Time of Session: 9:30am [name] MRN: [id number]  Principal Diagnosis: Polysubstance dependence, non-opioid, continuous ([place])  Secondary Diagnoses: Principal Problem:   Polysubstance dependence, non-opioid, continuous ([place])    Current Medications:  Current Facility-Administered Medications  Medication Dose Route Frequency Provider Last Rate Last Dose  . albuterol (PROVENTIL HFA;VENTOLIN HFA) 108 (90 Base) MCG/ACT inhaler 1-2 puff  1-2 puff Inhalation Q6H PRN Laverle Hobby, PA-C      . alum & mag hydroxide-simeth (MAALOX/MYLANTA) 200-200-20 MG/5ML suspension 30 mL  30 mL Oral Q4H PRN Laverle Hobby, PA-C      . DULoxetine (CYMBALTA) DR capsule 20 mg  20 mg Oral BID Laverle Hobby, PA-C      . hydrOXYzine (ATARAX/VISTARIL) tablet 25 mg  25 mg Oral Q6H PRN Patriciaann Clan E, PA-C      . loperamide (IMODIUM) capsule 2-4 mg  2-4 mg Oral PRN Laverle Hobby, PA-C      . LORazepam (ATIVAN) tablet 1 mg  1 mg Oral Q6H PRN Laverle Hobby, PA-C      . LORazepam (ATIVAN) tablet 1 mg  1 mg Oral QID Patriciaann Clan E, PA-C   1 mg at 06/27/16 1308   Followed by  . [START ON 06/28/2016] LORazepam (ATIVAN) tablet 1 mg  1 mg Oral TID Laverle Hobby, PA-C       Followed by  . [START ON 06/29/2016] LORazepam (ATIVAN) tablet 1 mg  1 mg Oral BID Patriciaann Clan E, PA-C       Followed by  . [START ON 06/30/2016] LORazepam (ATIVAN) tablet 1 mg  1 mg Oral Daily Simon, Spencer E, PA-C      . magnesium hydroxide (MILK OF MAGNESIA) suspension 30 mL  30 mL Oral Daily PRN Laverle Hobby, PA-C      . multivitamin with minerals tablet 1 tablet  1 tablet Oral Daily Laverle Hobby, PA-C   1 tablet at 06/27/16 1007  . OLANZapine (ZYPREXA) tablet 5 mg  5 mg Oral QHS Simon, Spencer E, PA-C      . ondansetron (ZOFRAN-ODT) disintegrating tablet 4 mg  4 mg Oral Q6H PRN Patriciaann Clan E, PA-C      . thiamine (B-1) injection 100 mg  100 mg  Intramuscular Once Patriciaann Clan E, PA-C      . thiamine (VITAMIN B-1) tablet 100 mg  100 mg Oral Daily Patriciaann Clan E, PA-C   100 mg at 06/27/16 1007  . topiramate (TOPAMAX) tablet 25 mg  25 mg Oral BID Patriciaann Clan E, PA-C        PTA Medications: Prescriptions Prior to Admission  Medication Sig Dispense Refill Last Dose  . amphetamine-dextroamphetamine (ADDERALL) 10 MG tablet Take 10 mg by mouth daily with breakfast.   06/25/2016 at Unknown time  . albuterol (PROVENTIL HFA;VENTOLIN HFA) 108 (90 Base) MCG/ACT inhaler Inhale 1-2 puffs into the lungs every 6 (six) hours as needed for wheezing or shortness of breath.    Unknown at Unknown time  . Apple Cider Vinegar 600 MG CAPS Take 1 capsule by mouth daily.   06/24/2016 at Unknown time  . co-enzyme Q-10 30 MG capsule Take 30 mg by mouth 3 (three) times daily.   06/24/2016 at Unknown time  . Cyanocobalamin (B-12 PO) Take 1 tablet by mouth daily.   06/24/2016 at Unknown time  . FLUoxetine (PROZAC) 40 MG capsule Take 1 capsule (40 mg total) by mouth daily. (Patient  not taking: Reported on 06/25/2016) 30 capsule 0 Not Taking at Unknown time  . LORazepam (ATIVAN) 0.5 MG tablet Take 1 tablet (0.5 mg total) by mouth at bedtime as needed for anxiety. 14 tablet 0 06/24/2016 at Unknown time  . Pyridoxine HCl (B-6 PO) Take 1 tablet by mouth daily.   06/24/2016 at Unknown time  . TURMERIC PO Take 1 tablet by mouth daily.   06/24/2016 at Unknown time  . vitamin C (ASCORBIC ACID) 500 MG tablet Take 500 mg by mouth daily.   06/24/2016 at Unknown time  . zolpidem (AMBIEN) 5 MG tablet Take 1 tablet (5 mg total) by mouth at bedtime. 30 tablet 0 06/24/2016 at Unknown time    Treatment Modalities: Medication Management, Group therapy, Case management,  1 to 1 session with clinician, Psychoeducation, Recreational therapy.  Patient Stressors: Financial difficulties Marital or family conflict Medication change or noncompliance Substance abuse  Patient Strengths:  Average or above average intelligence Capable of independent living General fund of knowledge Supportive family/friends  Physician Treatment Plan for Primary Diagnosis: Polysubstance dependence, non-opioid, continuous ([place]) Long Term Goal(s): Improvement in symptoms so as ready for discharge  Short Term Goals: Ability to identify changes in lifestyle to reduce recurrence of condition will improve Ability to verbalize feelings will improve Ability to disclose and discuss suicidal ideas Ability to demonstrate self-control will improve Ability to identify and develop effective coping behaviors will improve Compliance with prescribed medications will improve Ability to identify triggers associated with substance abuse/mental health issues will improve  Medication Management: Evaluate patient's response, side effects, and tolerance of medication regimen.  Therapeutic Interventions: 1 to 1 sessions, Unit Group sessions and Medication administration.  Evaluation of Outcomes: Not Met  Physician Treatment Plan for Secondary Diagnosis: Principal Problem:   Polysubstance dependence, non-opioid, continuous ([place])   Long Term Goal(s): Improvement in symptoms so as ready for discharge  Short Term Goals: Ability to identify changes in lifestyle to reduce recurrence of condition will improve Ability to verbalize feelings will improve Ability to disclose and discuss suicidal ideas Ability to demonstrate self-control will improve Ability to identify and develop effective coping behaviors will improve Compliance with prescribed medications will improve Ability to identify triggers associated with substance abuse/mental health issues will improve  Medication Management: Evaluate patient's response, side effects, and tolerance of medication regimen.  Therapeutic Interventions: 1 to 1 sessions, Unit Group sessions and Medication administration.  Evaluation of Outcomes: Not Met   RN Treatment Plan  for Primary Diagnosis: Polysubstance dependence, non-opioid, continuous ([place]) Long Term Goal(s): Knowledge of disease and therapeutic regimen to maintain health will improve  Short Term Goals: Ability to verbalize feelings will improve, Ability to disclose and discuss suicidal ideas and Ability to identify and develop effective coping behaviors will improve  Medication Management: RN will administer medications as ordered by provider, will assess and evaluate patient's response and provide education to patient for prescribed medication. RN will report any adverse and/or side effects to prescribing provider.  Therapeutic Interventions: 1 on 1 counseling sessions, Psychoeducation, Medication administration, Evaluate responses to treatment, Monitor vital signs and CBGs as ordered, Perform/monitor CIWA, COWS, AIMS and Fall Risk screenings as ordered, Perform wound care treatments as ordered.  Evaluation of Outcomes: Not Met   LCSW Treatment Plan for Primary Diagnosis: Polysubstance dependence, non-opioid, continuous ([place]) Long Term Goal(s): Safe transition to appropriate next level of care at discharge, Engage patient in therapeutic group addressing interpersonal concerns.  Short Term Goals: Engage patient in aftercare planning with referrals  and resources, Identify triggers associated with mental health/substance abuse issues and Increase skills for wellness and recovery  Therapeutic Interventions: Assess for all discharge needs, 1 to 1 time with Social worker, Explore available resources and support systems, Assess for adequacy in community support network, Educate family and significant other(s) on suicide prevention, Complete Psychosocial Assessment, Interpersonal group therapy.  Evaluation of Outcomes: Not Met   Progress in Treatment: Attending groups: Pt is new to milieu, continuing to assess  Participating in groups: Pt is new to milieu, continuing to assess  Taking medication as  prescribed: Yes, MD continues to assess for medication changes as needed Toleration medication: Yes, no side effects reported at this time Family/Significant other contact made: No, Pt declines Patient understands diagnosis: Continuing to assess Discussing patient identified problems/goals with staff: Yes Medical problems stabilized or resolved: Yes Denies suicidal/homicidal ideation: Yes Issues/concerns per patient self-inventory: None Other: N/A  New problem(s) identified: None identified at this time.   New Short Term/Long Term Goal(s): None identified at this time.   Discharge Plan or Barriers: Pt will return home. Currently declining referral for mental health services and reports that she will continue to follow-up with PCP for medications.  Reason for Continuation of Hospitalization: Anxiety Depression Medication stabilization Withdrawal symptoms  Estimated Length of Stay: 2-3 days  Attendees: Patient: 06/27/2016  1:19 PM  Physician: Dr. Shea Evans; Dr. Parke Poisson 06/27/2016  1:19 PM  Nursing: Minta Balsam, RN 06/27/2016  1:19 PM  RN Care Manager: Lars Pinks, RN 06/27/2016  1:19 PM  Social Worker: Adriana Reams, LCSW; Matthew Saras, [place] 06/27/2016  1:19 PM  Recreational Therapist:  06/27/2016  1:19 PM  Other: Lindell Spar, NP; Samuel Jester, NP 06/27/2016  1:19 PM  Other:  06/27/2016  1:19 PM  Other: 06/27/2016  1:19 PM    Scribe for Treatment Team: Gladstone Lighter, LCSW 06/27/2016 1:19 PM

## 2016-06-27 NOTE — [facility] Group Notes (Signed)
[facility] 06/27/2016 1:15pm  Type of Therapy: Mental Health Association Presentation  Participation Level: Active  Participation Quality: Attentive  Affect: Appropriate  Cognitive: Oriented  Insight: Developing/Improving  Engagement in Therapy: Engaged  Modes of Intervention: Discussion, Education and Socialization  Summary of Progress/Problems: Mental Health Association (MHA) Speaker came to talk about his personal journey with substance abuse and addiction. The pt processed ways by which to relate to the speaker. MHA speaker provided handouts and educational information pertaining to groups and services offered by the [facility]. Pt was engaged in speaker's presentation and was receptive to resources provided.    Laura ShanksLauren Clarice Bonaventure, LCSW 06/27/2016 2:59 PM
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
## 2016-06-27 NOTE — [facility] Suicide Risk Assessment (Addendum)
[facility] Admission Suicide Risk Assessment   Nursing information obtained from:  Patient, Review of record Demographic factors:  Adolescent or young adult, Caucasian, Low socioeconomic status, Unemployed Current Mental Status:  Self-harm thoughts, Self-harm behaviors Loss Factors:  Loss of significant relationship, Financial problems / change in socioeconomic status Historical Factors:  Family history of mental illness or substance abuse, Impulsivity Risk Reduction Factors:  Responsible for children under 56 years of age, Living with another person, especially a relative, Positive social support  Total Time spent with patient: 45 minutes Principal Problem:  Suicidal Ideations  Diagnosis:   Patient Active Problem List   Diagnosis Date Noted  . Polysubstance dependence, non-opioid, continuous (HCC) [F19.20] 06/27/2016  . Major depressive disorder, single episode, severe, without mention of psychotic behavior [F32.2]   . PTSD (post-traumatic stress disorder) [F43.10] 12/30/2014  . 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 Alcohol use disorder, moderate, dependence (HCC) [F10.20] 12/29/2014  . UTI (lower urinary tract infection) [N39.0] 07/21/2014  . Hypokalemia [E87.6] 07/21/2014  . Hyperglycemia [R73.9] 07/21/2014  . Pyelonephritis [N12] 07/21/2014    Continued Clinical Symptoms:  Alcohol Use Disorder Identification Test Final Score (AUDIT): 6 The "Alcohol Use Disorders Identification Test", Guidelines for Use in Primary Care, Second Edition.  World Science writer [facility]). Score between 0-7:  no or low risk or alcohol related problems. Score between 8-15:  moderate risk of alcohol related problems. Score between 16-19:  high risk of alcohol related problems. Score 20 or above:  warrants further diagnostic evaluation for alcohol dependence and treatment.   CLINICAL FACTORS:  31 year old female. She is separated, has three children, who are currently  staying with patient's mother. She is currently unemployed.   She reports " I had a bad day".  States she was doing "OK", without any suicidal ideations or self injurious ideations until this last weekend- mother's day. In fact, states " I have been doing much better in general compared to how I was doing in the past ".On that day had gone to family get together. States that the youngest of her children has special needs, and she felt overwhelmed because her family members  " they were all babying my kid way too much, and everyone became involved, it became this huge thing ".   States that after this she impulsively cut self ( has multiple superficial scratches on L forearm) and posted suicidal statements on Facebook. States that a friend then called police and she was brought to hospital.  Patient has a history of prior psychiatric admissions, most recently on 2016, for depression, alcohol abuse . States that in the past she has been diagnosed with Borderline Personality Disorder .  Last episode of self cutting was about 10 months ago.  She has history of alcohol use disorder, but states she has cut down significantly compared to before, drinks about 2 x week. Smokes cannabis occasionally .  Prior to admission was on Prozac, but had stopped taking it several weeks ago. She does take Adderall , Ativan (PRN)   Dx- Suicidal Ideations  Plan - inpatient admission - Ativan detox protocol. We discussed medications- on admission was started on Zyprexa /Topamax, no clear indication and patient states she does not want to take them. Prefers Prozac over Cymbalta, as states Prozac has helped in the past  D/C Zyprexa D/C Topamax  D/C Cymbalta Start Prozac 10 mgrs QDAY      Musculoskeletal: Strength & Muscle Tone: within normal limits Gait &  Station: normal Patient leans: N/A  Psychiatric Specialty Exam: Physical Exam  ROS no headache, no chest pain, no vomiting , no fever   Blood pressure 117/79,  pulse (!) 114, temperature 97.8 F (36.6 C), temperature source Oral, resp. rate 16, height 5\' 4"  (1.626 m), weight 73 kg (161 lb).Body mass index is 27.64 kg/m.  General Appearance: Fairly Groomed  Eye Contact:  Good  Speech:  Normal Rate  Volume:  Normal  Mood:  reports she is feeling better compared to admission, minimizes depression at this time  Affect:  Appropriate and reactive  Thought Process:  Linear and Descriptions of Associations: Intact  Orientation:  Full (Time, Place, and Person)  Thought Content:  denies any hallucinations, no delusions  Suicidal Thoughts:  No denies any current suicidal or self injurious ideations at this time and contracts for safety   Homicidal Thoughts:  No  Memory:  recent and remote grossly intact   Judgement:  Fair  Insight:  Fair  Psychomotor Activity:  no tremors, no diaphoresis, no restlessness or distress at this time  Concentration:  Concentration: Good and Attention Span: Good  Recall:  Good  Fund of Knowledge:  Good  Language:  Good  Akathisia:  Negative  Handed:  Right  AIMS (if indicated):     Assets:  Desire for Improvement Resilience  ADL's:  Intact  Cognition:  WNL  Sleep:  Number of Hours: 6      COGNITIVE FEATURES THAT CONTRIBUTE TO RISK:  Closed-mindedness and Loss of executive function    SUICIDE RISK:   Moderate:  Frequent suicidal ideation with limited intensity, and duration, some specificity in terms of plans, no associated intent, good self-control, limited dysphoria/symptomatology, some risk factors present, and identifiable protective factors, including available and accessible social support.  PLAN OF CARE: Patient will be admitted to inpatient psychiatric unit for stabilization and safety. Will provide and encourage milieu participation. Provide medication management and maked adjustments as needed.  Will follow daily.    I certify that inpatient services furnished can reasonably be expected to improve the  patient's condition.   Craige CottaFernando A Traci Gafford, MD 06/27/2016, 2:54 PM

## 2016-06-28 MED ORDER — FLUOXETINE HCL 20 MG PO CAPS
20.0000 mg | ORAL_CAPSULE | Freq: Every day | ORAL | Status: DC
  Administered 2016-06-29 – 2016-06-30 (×2): 20 mg via ORAL
  Filled 2016-06-28 (×4): qty 1

## 2016-06-28 NOTE — Progress Notes (Signed)
D: Pt irritable on approach. Pt refused to take meds this morning and afternoon. Dr. Jama Flavorsobos made aware in treatment team. Pt stated that the Ativan makes her sleepy and that she haven't been on Prozac in months. Pt stated that she needs to be on Adderall and Ambien but the doctor isn't listening to her. Pt isolates in her room and do not attend scheduled groups. A: Medications reviewed with pt. Medications administered as ordered per MD. Verbal support provided. Pt encouraged to attend groups. 15 minute checks performed for safety. R: Pt non-compliant with tx.

## 2016-06-28 NOTE — Progress Notes (Signed)
Recreation Therapy Notes  Date: 06/28/16 Time: 0930 Location: 300 Hall Dayroom  Group Topic: Stress Management  Goal Area(s) Addresses:  Patient will verbalize importance of using healthy stress management.  Patient will identify positive emotions associated with healthy stress management.   Intervention: Stress Management  Activity :  Body Scan Meditation.  LRT introduced the stress management technique of meditation.  LRT played Powell meditation from the Calm app to allow patients the opportunity to scan and acknowledge the sensations and tension within the body.  Patients were to follow along as the meditation played to engage in the activity.  Education:  Stress Management, Discharge Planning.   Education Outcome: Acknowledges edcuation/In group clarification offered/Needs additional education  Clinical Observations/Feedback: Pt did not attend group.   [name], LRT/CTRS         [name] 06/28/2016 12:52 PM 

## 2016-06-28 NOTE — Progress Notes (Signed)
[facility] MD Progress Note  06/28/2016 5:50 PM CESIA ORF  MRN:  [id number] Subjective:  Patient reports she feels frustrated, upset . She denies suicidal ideations. Ruminates about her family stressors, states she is upset that her mother may be trying to keep her children away from her. Presents focused on medications , states that " the medications that work for me are Ambien and Adderall", and expresses frustration  that she is not currently on these . Objective : I have discussed case with treatment team and have met with patient. Patient presents irritable, dysphoric, fairly related. Eye contact is fair. Affect is labile, and is at times loud, angry, at others briefly tearful, particularly when discussing her family situation. States she thinks her mother is going to try to get legal paperwork to prevent patient from being alone with her children. Tends to minimize recent events which led to admission, and states she feels " the whole thing has been blown way out of proportion".  Denies suicidal ideations. As per staff, patient has remained irritable, isolative, and has been refusing medications.   Principal Problem: Suicidal ideations Diagnosis:   Patient Active Problem List   Diagnosis Date Noted  . Polysubstance dependence, non-opioid, continuous ([place]) [F19.20] 06/27/2016  . Suicidal ideations [R45.851] 06/27/2016  . Major depressive disorder, single episode, severe, without mention of psychotic behavior [F32.2]   . PTSD (post-traumatic stress disorder) [F43.10] 12/30/2014  . Severe major depression without psychotic features ([place]) [F32.2] 12/29/2014  . Alcohol use disorder, moderate, dependence ([place]) [F10.20] 12/29/2014  . UTI (lower urinary tract infection) [N39.0] 07/21/2014  . Hypokalemia [E87.6] 07/21/2014  . Hyperglycemia [R73.9] 07/21/2014  . Pyelonephritis [N12] 07/21/2014   Total Time spent with patient: 20 minutes   Past Medical History:  Past Medical History:   Diagnosis Date  . Anxiety   . Asthma    no current med.  . Depression   . Hand laceration involving tendon 12/13/2014   right  . Headache   . Open metacarpal fracture 12/13/2014   right  . PTSD (post-traumatic stress disorder)     Past Surgical History:  Procedure Laterality Date  . BONE TUMOR EXCISION Left    great toe  . CESAREAN SECTION  03/02/2013  . DILATION AND CURETTAGE OF UTERUS  01/12/2006   suction D & C  . HAND SURGERY     right  . I&D EXTREMITY Right 12/17/2014   Procedure: RIGHT HAND IRRIGATION AND DEBRIDEMENT,;  Surgeon: Leanora Cover, MD;  Location: [place];  Service: Orthopedics;  Laterality: Right;  . REPAIR EXTENSOR TENDON Right 12/17/2014   Procedure: REPAIR EXTENSOR TENDON;  Surgeon: Leanora Cover, MD;  Location: [place];  Service: Orthopedics;  Laterality: Right;  . TUBAL LIGATION    . WISDOM TOOTH EXTRACTION     Family History:  Family History  Problem Relation Age of Onset  . Hypertension Father    Social History:  History  Alcohol Use  . 0.0 oz/week    Comment: occasionally     History  Drug Use  . Types: Marijuana    Social History   Social History  . Marital status: Legally Separated    Spouse name: N/A  . Number of children: N/A  . Years of education: N/A   Social History Main Topics  . Smoking status: Former Smoker    Packs/day: 0.00    Years: 0.00    Types: Cigarettes    Quit date: 02/12/2006  . Smokeless tobacco: Never  Used  . Alcohol use 0.0 oz/week     Comment: occasionally  . Drug use: Yes    Types: Marijuana  . Sexual activity: Yes    Birth control/ protection: None   Other Topics Concern  . None   Social History Narrative  . None   Additional Social History:    Pain Medications: See home med list Prescriptions: See home med list Over the Counter: See home med list History of alcohol / drug use?: Yes Negative Consequences of Use: Financial, Personal relationships Name of  Substance 1: Alcohol 1 - Age of First Use: teens 1 - Amount (size/oz): 2-4 shots 1 - Frequency: 2-3 times a week-"occasionally" 1 - Duration: ongoing 1 - Last Use / Amount: 5/13 Name of Substance 2: THC 2 - Age of First Use: teens 2 - Amount (size/oz): varies 2 - Frequency: "when it's offered to me" 2 - Duration: ongoing 2 - Last Use / Amount: unknown  Sleep: Good  Appetite:  Fair  Current Medications: Current Facility-Administered Medications  Medication Dose Route Frequency Provider Last Rate Last Dose  . albuterol (PROVENTIL HFA;VENTOLIN HFA) 108 (90 Base) MCG/ACT inhaler 1-2 puff  1-2 puff Inhalation Q6H PRN Laverle Hobby, PA-C      . alum & mag hydroxide-simeth (MAALOX/MYLANTA) 200-200-20 MG/5ML suspension 30 mL  30 mL Oral Q4H PRN Laverle Hobby, PA-C      . FLUoxetine (PROZAC) capsule 10 mg  10 mg Oral Daily Annet Manukyan A, MD      . hydrOXYzine (ATARAX/VISTARIL) tablet 25 mg  25 mg Oral Q6H PRN Patriciaann Clan E, PA-C      . loperamide (IMODIUM) capsule 2-4 mg  2-4 mg Oral PRN Laverle Hobby, PA-C      . LORazepam (ATIVAN) tablet 1 mg  1 mg Oral Q6H PRN Laverle Hobby, PA-C      . LORazepam (ATIVAN) tablet 1 mg  1 mg Oral TID Laverle Hobby, PA-C       Followed by  . [START ON 06/29/2016] LORazepam (ATIVAN) tablet 1 mg  1 mg Oral BID Patriciaann Clan E, PA-C       Followed by  . [START ON 06/30/2016] LORazepam (ATIVAN) tablet 1 mg  1 mg Oral Daily Simon, Spencer E, PA-C      . magnesium hydroxide (MILK OF MAGNESIA) suspension 30 mL  30 mL Oral Daily PRN Laverle Hobby, PA-C      . multivitamin with minerals tablet 1 tablet  1 tablet Oral Daily Laverle Hobby, PA-C   1 tablet at 06/27/16 1007  . ondansetron (ZOFRAN-ODT) disintegrating tablet 4 mg  4 mg Oral Q6H PRN Patriciaann Clan E, PA-C      . thiamine (B-1) injection 100 mg  100 mg Intramuscular Once Patriciaann Clan E, PA-C      . thiamine (VITAMIN B-1) tablet 100 mg  100 mg Oral Daily Patriciaann Clan E, PA-C    100 mg at 06/27/16 1007    Lab Results: No results found for this or any previous visit (from the past 48 hour(s)).  Blood Alcohol level:  Lab Results  Component Value Date   ETH 84 (H) 06/25/2016   ETH <5 16/11/9602    Metabolic Disorder Labs: No results found for: HGBA1C, MPG No results found for: PROLACTIN No results found for: CHOL, TRIG, HDL, CHOLHDL, VLDL, LDLCALC  Physical Findings: AIMS: Facial and Oral Movements Muscles of Facial Expression: None, normal Lips and Perioral Area: None, normal Jaw:  None, normal Tongue: None, normal,Extremity Movements Upper (arms, wrists, hands, fingers): None, normal Lower (legs, knees, ankles, toes): None, normal, Trunk Movements Neck, shoulders, hips: None, normal, Overall Severity Severity of abnormal movements (highest score from questions above): None, normal Incapacitation due to abnormal movements: None, normal Patient's awareness of abnormal movements (rate only patient's report): No Awareness, Dental Status Current problems with teeth and/or dentures?: No Does patient usually wear dentures?: No  CIWA:  CIWA-Ar Total: 0 COWS:     Musculoskeletal: Strength & Muscle Tone: within normal limits no  tremors or diaphoresis, vaguely irritable and restless Gait & Station: normal Patient leans: N/A  Psychiatric Specialty Exam: Physical Exam  ROS denies chest pain, no shortness of breath, no vomiting   Blood pressure 120/77, pulse (!) 102, temperature 98.4 F (36.9 C), temperature source Oral, resp. rate 18, height '5\' 4"'$  (1.626 m), weight 73 kg (161 lb).Body mass index is 27.64 kg/m.  General Appearance: Fairly Groomed  Eye Contact:  Fair  Speech:  variable  Volume:  variable, loud, angry at times   Mood:  Dysphoric and Irritable  Affect:  congruent , briefly tearful at times   Thought Process:  Linear and Descriptions of Associations: Intact  Orientation:  Other:  fully alert and attentive   Thought Content:  denies  hallucinations, no delusions, not internally preoccupied   Suicidal Thoughts:  No denies suicidal or self injurious ideations, denies any homicidal or violent ideations   Homicidal Thoughts:  No  Memory:  recent and remote grossly intact   Judgement:  Fair  Insight:  Fair  Psychomotor Activity:  Normal  Concentration:  Concentration: Fair and Attention Span: Fair  Recall:  Good  Fund of Knowledge:  Good  Language:  Good  Akathisia:  Negative  Handed:  Right  AIMS (if indicated):     Assets:  Desire for Improvement Resilience  ADL's:   Fair   Cognition:  WNL  Sleep:  Number of Hours: 6.75   Assessment - patient presents irritable, dysphoric, angry. She denies suicidal ideations.  She attributes irritability in part to not being on Ambien and Adderall, which she states she was taking prior to admission , and also due to concerns that her mother may be trying to obtain legal paperwork so that she cannot be with her children alone after discharge At this time is not presenting with significant alcohol withdrawal symptoms, and vitals are stable.  Treatment Plan Summary: Daily contact with patient to assess and evaluate symptoms and progress in treatment, Medication management, Plan inpatient treatment  and medications as below Encourage increased group and milieu participation to work on coping skills and symptom reduction Continue to encourage efforts to work on abstinence, sobriety Continue Ativan detox protocol to minimize risk of alcohol WDL  Continue Prozac at 20 mgrs QDAY  Have reviewed with patient rationale to minimize/avoid potentially addictive medications , and discussed other non stimulant options to address ADHD type symptoms.  Jenne Campus, MD 06/28/2016, 5:50 PM

## 2016-06-28 NOTE — Progress Notes (Signed)
Adult Psychoeducational Group Note  Date:  06/28/2016 Time:  9:58 PM  Group Topic/Focus:  Wrap-Up Group:   The focus of this group is to help patients review their daily goal of treatment and discuss progress on daily workbooks.  Participation Level:  Minimal  Participation Quality:  Attentive  Affect:  Appropriate  Cognitive:  Appropriate  Insight: Improving  Engagement in Group:  Developing/Improving  Modes of Intervention:  Discussion  Additional Comments: Pt shared in group that she slept well today and took a shower.   Karleen HampshireFox, Olie Scaffidi Brittini 06/28/2016, 9:58 PM

## 2016-06-28 NOTE — [facility] Group Notes (Signed)
[facility] LCSW Group Therapy 06/28/2016 1:15 PM  Type of Therapy: Group Therapy- Emotion Regulation  Pt did not attend, declined invitation.   Vernie ShanksLauren Aarna Mihalko, LCSW 06/28/2016 3:27 PM

## 2016-06-29 DIAGNOSIS — Z87891 Personal history of nicotine dependence: Secondary | ICD-10-CM

## 2016-06-29 DIAGNOSIS — F1290 Cannabis use, unspecified, uncomplicated: Secondary | ICD-10-CM

## 2016-06-29 DIAGNOSIS — F1920 Other psychoactive substance dependence, uncomplicated: Principal | ICD-10-CM

## 2016-06-29 NOTE — Progress Notes (Signed)
Patient ID: [name], female   DOB: 07/30/1985, 30 y.o.   MRN: [id number] D: Client visible on the unit, seen dayroom interacting with peers, watching TV. Client reports of her day "pretty good" notes today's goal: "to talk about discharge" Client says of her suicide statements on FB, "made some bad choices, that's not really how I felt" Client plans to follow up with a provider. A: Writer provided emotional support, encouraged client to use resources, follow suicide safety plan. Medications reviewed, administered as ordered. Staff will monitor q5215min for safety. R:Client is safe on the unit, attended karaoke.

## 2016-06-29 NOTE — Progress Notes (Signed)
[facility] MD Progress Note  06/29/2016 2:26 PM [name]  MRN:  [id number]  Subjective: [name] reports, I'm doing much better today than yesterday. My mood is improving gradually". She continues to ruminate about her family stressors, states she is upset that her mother may be trying to keep her children away from her. Presents focused on medications , states that " the medications that work for me are Ambien and Adderall", and expresses frustration  that she is not currently on these .  Objective: I have discussed case with treatment team and have met with patient. Patient presents more calm today & less dysphoric, She is making better Eye contact. Affect has improved & more reactive. She is tearful, particularly when discussing her family situation & says it will be good to be discharged from the hospital soon so that she can celebrate her birthday with her children. States she thinks her mother is going to try to get legal paperwork to prevent patient from being alone with her children. Tends to minimize recent events which led to admission, and states she feels " the whole thing has been blown way out of proportion".  Denies suicidal ideations. As per staff, patient has started taking her medications. She is more visible on the unit today, participating in the group sessions.  Principal Problem: Suicidal ideations  Diagnosis:   Patient Active Problem List   Diagnosis Date Noted  . Polysubstance dependence, non-opioid, continuous ([place]) [F19.20] 06/27/2016    Priority: High  . Major depressive disorder, single episode, severe, without mention of psychotic behavior [F32.2]     Priority: Medium  . Alcohol use disorder, moderate, dependence ([place]) [F10.20] 12/29/2014    Priority: Medium  . Suicidal ideations [R45.851] 06/27/2016  . PTSD (post-traumatic stress disorder) [F43.10] 12/30/2014  . Severe major depression without psychotic features ([place]) [F32.2] 12/29/2014  . UTI (lower urinary  tract infection) [N39.0] 07/21/2014  . Hypokalemia [E87.6] 07/21/2014  . Hyperglycemia [R73.9] 07/21/2014  . Pyelonephritis [N12] 07/21/2014   Total Time spent with patient: 15 minutes  Past Medical History:  Past Medical History:  Diagnosis Date  . Anxiety   . Asthma    no current med.  . Depression   . Hand laceration involving tendon 12/13/2014   right  . Headache   . Open metacarpal fracture 12/13/2014   right  . PTSD (post-traumatic stress disorder)     Past Surgical History:  Procedure Laterality Date  . BONE TUMOR EXCISION Left    great toe  . CESAREAN SECTION  03/02/2013  . DILATION AND CURETTAGE OF UTERUS  01/12/2006   suction D & C  . HAND SURGERY     right  . I&D EXTREMITY Right 12/17/2014   Procedure: RIGHT HAND IRRIGATION AND DEBRIDEMENT,;  Surgeon: Leanora Cover, MD;  Location: [place];  Service: Orthopedics;  Laterality: Right;  . REPAIR EXTENSOR TENDON Right 12/17/2014   Procedure: REPAIR EXTENSOR TENDON;  Surgeon: Leanora Cover, MD;  Location: [place];  Service: Orthopedics;  Laterality: Right;  . TUBAL LIGATION    . WISDOM TOOTH EXTRACTION     Family History:  Family History  Problem Relation Age of Onset  . Hypertension Father    Social History:  History  Alcohol Use  . 0.0 oz/week    Comment: occasionally     History  Drug Use  . Types: Marijuana    Social History   Social History  . Marital status: Legally Separated    Spouse  name: N/A  . Number of children: N/A  . Years of education: N/A   Social History Main Topics  . Smoking status: Former Smoker    Packs/day: 0.00    Years: 0.00    Types: Cigarettes    Quit date: 02/12/2006  . Smokeless tobacco: Never Used  . Alcohol use 0.0 oz/week     Comment: occasionally  . Drug use: Yes    Types: Marijuana  . Sexual activity: Yes    Birth control/ protection: None   Other Topics Concern  . None   Social History Narrative  . None   Additional  Social History:    Pain Medications: See home med list Prescriptions: See home med list Over the Counter: See home med list History of alcohol / drug use?: Yes Negative Consequences of Use: Financial, Personal relationships Name of Substance 1: Alcohol 1 - Age of First Use: teens 1 - Amount (size/oz): 2-4 shots 1 - Frequency: 2-3 times a week-"occasionally" 1 - Duration: ongoing 1 - Last Use / Amount: 5/13 Name of Substance 2: THC 2 - Age of First Use: teens 2 - Amount (size/oz): varies 2 - Frequency: "when it's offered to me" 2 - Duration: ongoing 2 - Last Use / Amount: unknown  Sleep: Good  Appetite:  Fair  Current Medications: Current Facility-Administered Medications  Medication Dose Route Frequency Provider Last Rate Last Dose  . albuterol (PROVENTIL HFA;VENTOLIN HFA) 108 (90 Base) MCG/ACT inhaler 1-2 puff  1-2 puff Inhalation Q6H PRN Kerry Hough, PA-C      . alum & mag hydroxide-simeth (MAALOX/MYLANTA) 200-200-20 MG/5ML suspension 30 mL  30 mL Oral Q4H PRN Kerry Hough, PA-C      . FLUoxetine (PROZAC) capsule 20 mg  20 mg Oral Daily Tiffiney Sparrow, Rockey Situ, MD   20 mg at 06/29/16 4401  . hydrOXYzine (ATARAX/VISTARIL) tablet 25 mg  25 mg Oral Q6H PRN Donell Sievert E, PA-C      . loperamide (IMODIUM) capsule 2-4 mg  2-4 mg Oral PRN Kerry Hough, PA-C      . LORazepam (ATIVAN) tablet 1 mg  1 mg Oral Q6H PRN Kerry Hough, PA-C   1 mg at 06/28/16 2217  . LORazepam (ATIVAN) tablet 1 mg  1 mg Oral BID Donell Sievert E, PA-C       Followed by  . [START ON 06/30/2016] LORazepam (ATIVAN) tablet 1 mg  1 mg Oral Daily Simon, Spencer E, PA-C      . magnesium hydroxide (MILK OF MAGNESIA) suspension 30 mL  30 mL Oral Daily PRN Kerry Hough, PA-C      . multivitamin with minerals tablet 1 tablet  1 tablet Oral Daily Kerry Hough, PA-C   1 tablet at 06/29/16 0805  . ondansetron (ZOFRAN-ODT) disintegrating tablet 4 mg  4 mg Oral Q6H PRN Donell Sievert E, PA-C      .  thiamine (B-1) injection 100 mg  100 mg Intramuscular Once Donell Sievert E, PA-C      . thiamine (VITAMIN B-1) tablet 100 mg  100 mg Oral Daily Donell Sievert E, PA-C   100 mg at 06/29/16 0272   Lab Results: No results found for this or any previous visit (from the past 48 hour(s)).  Blood Alcohol level:  Lab Results  Component Value Date   ETH 84 (H) 06/25/2016   ETH <5 12/28/2014    Metabolic Disorder Labs: No results found for: HGBA1C, MPG No results found for: PROLACTIN No  results found for: CHOL, TRIG, HDL, CHOLHDL, VLDL, LDLCALC  Physical Findings: AIMS: Facial and Oral Movements Muscles of Facial Expression: None, normal Lips and Perioral Area: None, normal Jaw: None, normal Tongue: None, normal,Extremity Movements Upper (arms, wrists, hands, fingers): None, normal Lower (legs, knees, ankles, toes): None, normal, Trunk Movements Neck, shoulders, hips: None, normal, Overall Severity Severity of abnormal movements (highest score from questions above): None, normal Incapacitation due to abnormal movements: None, normal Patient's awareness of abnormal movements (rate only patient's report): No Awareness, Dental Status Current problems with teeth and/or dentures?: No Does patient usually wear dentures?: No  CIWA:  CIWA-Ar Total: 1 COWS:     Musculoskeletal: Strength & Muscle Tone: within normal limits no  tremors or diaphoresis, vaguely irritable and restless Gait & Station: normal Patient leans: N/A  Psychiatric Specialty Exam: Physical Exam  Review of Systems  Psychiatric/Behavioral: Positive for depression ("Improving") and substance abuse (Hx. alcoholism, benzodiazepine & THC use). Negative for hallucinations, memory loss and suicidal ideas. The patient is nervous/anxious ("Improving") and has insomnia ("Improving").    :Denies chest pain, no shortness of breath, no vomiting   Blood pressure 113/80, pulse (!) 111, temperature 98.5 F (36.9 C), temperature source  Oral, resp. rate 16, height 5\' 4"  (1.626 m), weight 73 kg (161 lb).Body mass index is 27.64 kg/m.  General Appearance: Fairly Groomed  Eye Contact:  Fair  Speech:  variable  Volume:  variable, loud, angry at times   Mood:  Dysphoric and Irritable  Affect:  congruent , briefly tearful at times   Thought Process:  Linear and Descriptions of Associations: Intact  Orientation:  Other:  fully alert and attentive   Thought Content:  denies hallucinations, no delusions, not internally preoccupied   Suicidal Thoughts:  No denies suicidal or self injurious ideations, denies any homicidal or violent ideations   Homicidal Thoughts:  No  Memory:  recent and remote grossly intact   Judgement:  Fair  Insight:  Fair  Psychomotor Activity:  Normal  Concentration:  Concentration: Fair and Attention Span: Fair  Recall:  Good  Fund of Knowledge:  Good  Language:  Good  Akathisia:  Negative  Handed:  Right  AIMS (if indicated):     Assets:  Desire for Improvement Resilience  ADL's:   Fair   Cognition:  WNL  Sleep:  Number of Hours: 5.25   Assessment - Patient presents irritable, dysphoric, angry. She denies suicidal ideations.  She attributes irritability in part to not being on Ambien and Adderall, which she states she was taking prior to admission , and also due to concerns that her mother may be trying to obtain legal paperwork so that she cannot be with her children alone after discharge At this time is not presenting with significant alcohol withdrawal symptoms, and vitals are stable.  Treatment Plan Summary: Daily contact with patient to assess and evaluate symptoms and progress in treatment, Medication management, Plan inpatient treatment  and medications as below   Will continue today 06/29/16 plan as below except where it is noted.  Encourage increased group and milieu participation to work on coping skills and symptom reduction Continue to encourage efforts to work on abstinence,  sobriety Continue Ativan detox protocol to minimize risk of alcohol WDL  Continue Prozac at 20 mgrs Q DAY for depression.  Have reviewed with patient rationale to minimize/avoid potentially addictive medications , and discussed other non stimulant options to address ADHD type symptoms. SW continue to work oh 07/01/16.  [place],  Loleta Dicker, NP, PMHNP, FNP-BC. 06/29/2016, 2:26 PMPatient ID: Nicholes Rough, female   DOB: September 22, 1985, 31 y.o.   MRN: [id number] Agree with NP Progress Note

## 2016-06-29 NOTE — Plan of Care (Signed)
Problem: Medication: Goal: Compliance with prescribed medication regimen will improve Outcome: Progressing Pt compliant with taking antidepressant (Prozac)  today.

## 2016-06-29 NOTE — Progress Notes (Signed)
Pt approached CSW and requested that CSW reach out to [place] due to Pt's mother reporting that police had interviewed children and felt that CPS was involved. CSW called [place] CPS intake who reports that there is no open case under Pt's name and no active investigation. CSW provided details of admission and home environment and CPS intake reports that this situation would not be a screened in report.   CSW relayed information to Pt.   Vernie ShanksLauren Ramello Cordial, LCSW Clinical Social Work [phone]

## 2016-06-29 NOTE — Progress Notes (Signed)
Adult Psychoeducational Group Note  Date:  06/29/2016 Time:  0845 am  Group Topic/Focus:  Orientation:   The focus of this group is to educate the patient on the purpose and policies of crisis stabilization and provide a format to answer questions about their admission.  The group details unit policies and expectations of patients while admitted.  Participation Level:  Active  Participation Quality:  Appropriate  Affect:  Appropriate  Cognitive:  Appropriate  Insight: Appropriate  Engagement in Group:  Engaged  Modes of Intervention:  Discussion, Education and Orientation  Additional Comments:    Nazli Penn L 06/29/2016, 4:44 PM

## 2016-06-29 NOTE — Progress Notes (Signed)
Pt was irritable at the beginning of the shift this evening.  She is having issues with her mother who is trying to get custody of her children, and has been on the phone talking to her.  Pt is also angry that some of her home meds were not reordered such as her Adderall and Ambien.  She says she doesn't understand what the big deal is about her medications and that she will take them [facility].  She denies SI/HI/AVH at this time.  She denies having any withdrawal symptoms.  She hopes to discharge very soon.  Support and encouragement offered.  Discharge plans are in process.  Safety maintained with q15 minute checks.

## 2016-06-29 NOTE — Progress Notes (Signed)
D: Pt appears less agitated and anxious today. Pt rates depression 3/10. Anxiety 3/10. Pt denies SI. Pt compliant with taking Prozac this morning as prescribed. Pt refused to take Ativan this morning as prescribed, stating "I'm not anxious and I'm not having any withdrawal symptoms. Dr. Jama Flavorsobos made aware.  Pt visible on the unit today. Pt attended groups and participated. A: Medications reviewed with pt. Medications administered as ordered per MD.verbal support provided. Pt encouraged to attend groups. 15 minute checks performed for safety.  R: Pt compliant with tx.

## 2016-06-29 NOTE — [facility] Group Notes (Signed)
[facility] LCSW Group Therapy 06/29/2016 1:15pm  Type of Therapy: Group Therapy- Balance in Life  Participation Level: Active   Description of the Group:  The topic for group was balance in life. Today's group focused on defining balance in one's own words, identifying things that can knock one off balance, and exploring healthy ways to maintain balance in life. Group members were asked to provide an example of a time when they felt off balance, describe how they handled that situation,and process healthier ways to regain balance in the future. Group members were asked to share the most important tool for maintaining balance that they learned [facility] and how they plan to apply this method after discharge.  Summary of Patient Progress Pt participated in group discussion and was distracting at times due to side conversations. Pt reports that lack of structure causes her life to be unbalanced. Pt also discussed her tendency to sabotage relationships due to fear of abandonment.     Therapeutic Modalities:   Cognitive Behavioral Therapy Solution-Focused Therapy Assertiveness Training   Vernie ShanksLauren Maye Parkinson, LCSW 06/29/2016 6:29 PM

## 2016-06-29 NOTE — Progress Notes (Signed)
Pt attended Karaoke group. 

## 2016-06-30 MED ORDER — FLUOXETINE HCL 20 MG PO CAPS: 20.0000 mg | ORAL_CAPSULE | Freq: Every day | ORAL | 0 refills | Status: AC

## 2016-06-30 MED ORDER — LORAZEPAM 0.5 MG PO TABS: 0.5000 mg | ORAL_TABLET | Freq: Every evening | ORAL | 0 refills | Status: DC | PRN

## 2016-06-30 MED ORDER — ALBUTEROL SULFATE HFA 108 (90 BASE) MCG/ACT IN AERS: 1.0000 | INHALATION_SPRAY | Freq: Four times a day (QID) | RESPIRATORY_TRACT | Status: AC | PRN

## 2016-06-30 NOTE — Progress Notes (Addendum)
  [facility] Discharge [facility] :  Will you be returning to the same living situation after discharge:  Yes,  home  At discharge, do you have transportation home?: Yes,  insurance  Do you have the ability to pay for your medications: Yes,  mental health   Release of information consent forms completed and in the chart;  Patient's signature needed at discharge.  Patient to Follow up at: Follow-up Information    [place].   Why:  at 12:45pm with Katie for medication management.  Contact information: [address] [facility] [place] [phone]        Pt declines other mental health follow-up Follow up.           Next level of care provider has access to [facility] Link:no  Safety Planning and Suicide Prevention discussed: Yes,  with patient   Have you used any form of tobacco in the last 30 days? (Cigarettes, Smokeless Tobacco, Cigars, and/or Pipes): No  Has patient been referred to the Quitline?: N/A patient is not a smoker  Patient has been referred for addiction treatment: Pt. refused referral  Rondall AllegraCandace L Hyatt MSW, LCSWA  06/30/2016, 11:52 AM

## 2016-06-30 NOTE — [facility] Counselor (Signed)
CSW contacted [facility]. They only have therapist available in the [place] office. Pt has refused therapy unless [place]. Resources are extremely limited.   Daisy FloroCandace L Juniper [place] MSW, LCSWA  06/30/2016 11:31 AM

## 2016-06-30 NOTE — Discharge Summary (Signed)
Physician Discharge Summary Note  Patient:  [name] is an 31 y.o., female MRN:  [id number] DOB:  1985/12/28 Patient phone:  [phone] (home)  Patient address:   [address] [place] [address],  Total Time spent with patient: Greater than 30 minutes  Date of Admission:  06/26/2016 Date of Discharge: 06-30-16  Reason for Admission: Suicidal ideations & attem threats.  Principal Problem: Major depressive disorder, Polysubstance use disorder.  Discharge Diagnoses: Patient Active Problem List   Diagnosis Date Noted  . Polysubstance dependence, non-opioid, continuous (HCC) [F19.20] 06/27/2016    Priority: High  . Major depressive disorder, single episode, severe, without mention of psychotic behavior [F32.2]     Priority: Medium  . Alcohol use disorder, moderate, dependence (HCC) [F10.20] 12/29/2014    Priority: Medium  . Suicidal ideations [R45.851] 06/27/2016  . PTSD (post-traumatic stress disorder) [F43.10] 12/30/2014  . 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 UTI (lower urinary tract infection) [N39.0] 07/21/2014  . Hypokalemia [E87.6] 07/21/2014  . Hyperglycemia [R73.9] 07/21/2014  . Pyelonephritis [N12] 07/21/2014   Past Psychiatric History: PTSD, Major depression, Polysubstance use disorder.  Past Medical History:  Past Medical History:  Diagnosis Date  . Anxiety   . Asthma    no current med.  . Depression   . Hand laceration involving tendon 12/13/2014   right  . Headache   . Open metacarpal fracture 12/13/2014   right  . PTSD (post-traumatic stress disorder)     Past Surgical History:  Procedure Laterality Date  . BONE TUMOR EXCISION Left    great toe  . CESAREAN SECTION  03/02/2013  . DILATION AND CURETTAGE OF UTERUS  01/12/2006   suction D & C  . HAND SURGERY     right  . I&D EXTREMITY Right 12/17/2014   Procedure: RIGHT HAND IRRIGATION AND DEBRIDEMENT,;  Surgeon: Betha Loa, MD;  Location: [facility];  Service: Orthopedics;  Laterality: Right;  . REPAIR EXTENSOR TENDON Right 12/17/2014   Procedure: REPAIR EXTENSOR TENDON;  Surgeon: Betha Loa, MD;  Location: [facility];  Service: Orthopedics;  Laterality: Right;  . TUBAL LIGATION    . WISDOM TOOTH EXTRACTION     Family History:  Family History  Problem Relation Age of Onset  . Hypertension Father    Family Psychiatric  History: See H&P  Social History:  History  Alcohol Use  . 0.0 oz/week    Comment: occasionally     History  Drug Use  . Types: Marijuana    Social History   Social History  . Marital status: Legally Separated    Spouse name: N/A  . Number of children: N/A  . Years of education: N/A   Social History Main Topics  . Smoking status: Former Smoker    Packs/day: 0.00    Years: 0.00    Types: Cigarettes    Quit date: 02/12/2006  . Smokeless tobacco: Never Used  . Alcohol use 0.0 oz/week     Comment: occasionally  . Drug use: Yes    Types: Marijuana  . Sexual activity: Yes    Birth control/ protection: None   Other Topics Concern  . None   Social History Narrative  . None   Hospital Course: (Per adm. SRA): [place] is a 31 year old female. She is separated, has three children, who are currently staying with patient's mother. She is currently unemployed. She reports "I had a bad day". States she was doing "OK",  without any suicidal ideations or self injurious ideations until this last weekend- mother's day. In fact, states " I have been doing much better in general compared to how I was doing in the past ".On that day had gone to family get together. States that the youngest of her children has special needs, and she felt overwhelmed because her family members " they were all babying my kid way too much, and everyone became involved, it became this huge thing". States that after this she impulsively cut herself (has multiple superficial scratches on L forearm) & posted  suicidal statements on Facebook. States that a friend then called police and she was brought to hospital. Patient has a history of prior psychiatric admissions, most recently in 2016, for depression, alcohol abuse. States that in the past she has been diagnosed with Borderline Personality Disorder. Last episode of self cutting was about 10 months ago. She has history of alcohol use disorder, but states she has cut down significantly compared to before, drinks about 2 x week. Smokes cannabis occasionally. Prior to admission was on Prozac, but had stopped taking it several weeks ago. She does take Adderall, Ativan (PRN). TurkeyVictoria says she is no longer suicidal or was she suicidal when she posted something on Face Book. She says that she actually did that to piss her family off. She adds, "it was like telling them, fuck you all".  Benetta SparVictoria was admitted to the hospital with a BAL of 84 per toxicology tests results & UDS positive for Benzodiazepine & THC. However, reports indicated that she posted a suicidal threats on facebook that she will kill herself. She was brought to the hospital for mental health evaluation & treatment. Her BAL on admission was 84 & UDS positive for Benzodiazepine & THC. She was in need of detoxification as well as mood stabilization treatments. Miangel's detoxification treatment was achieved using Ativan detox regimen on a tapering dose format. She was enrolled in the group counseling sessions, AA/NA meetings being offered and held on this unit. She participated and learned coping skills. She tolerated her treatment regimen without any significant adverse effects and or reactions reported.  Besides the detoxification treatments, TurkeyVictoria also was medicated & discharged on Prozac 20 mg for depression & Ativan 0.5 mg Q hs for insomnia. She received no other medications regimen as she presented n other medical issues that required treatment. She tolerated her treatment regimen without any  adverse effects or reactions reported.   TurkeyVictoria has completed detox treatment & her mood is stable. This is evidenced by her reports of improved mood & absence of substance withdrawal symptoms. She is encouraged to join/attend AA/NA meetings being offered and held within her community to achieve & maintain maximum sobriety. However, she has declined any follow-up care for substance abuse & mental health care.  Upon discharge, TurkeyVictoria adamantly denies any suicidal, homicidal ideations, auditory, visual hallucinations, delusional thoughts, paranoia & or substance withdrawal symptoms. [facility] in no apparent distress. Transportation per her arrangement.  Physical Findings:  AIMS: Facial and Oral Movements Muscles of Facial Expression: None, normal Lips and Perioral Area: None, normal Jaw: None, normal Tongue: None, normal,Extremity Movements Upper (arms, wrists, hands, fingers): None, normal Lower (legs, knees, ankles, toes): None, normal, Trunk Movements Neck, shoulders, hips: None, normal, Overall Severity Severity of abnormal movements (highest score from questions above): None, normal Incapacitation due to abnormal movements: None, normal Patient's awareness of abnormal movements (rate only patient's report): No Awareness, Dental Status  Current problems with teeth and/or dentures?: No Does patient usually wear dentures?: No  CIWA:  CIWA-Ar Total: 0 COWS:     Musculoskeletal: Strength & Muscle Tone: within normal limits Gait & Station: normal Patient leans: N/A  Psychiatric Specialty Exam: Physical Exam  Constitutional: She appears well-developed.  HENT:  Head: Normocephalic.  Eyes: Pupils are equal, round, and reactive to light.  Neck: Normal range of motion.  Cardiovascular: Normal rate.   Respiratory: Effort normal.  GI: Soft.  Genitourinary:  Genitourinary Comments: Deferred  Musculoskeletal: Normal range of motion.  Neurological: She  is alert.  Skin: Skin is warm.    Review of Systems  Constitutional: Negative.   HENT: Negative.   Eyes: Negative.   Respiratory: Negative.   Cardiovascular: Negative.   Gastrointestinal: Negative.   Genitourinary: Negative.   Musculoskeletal: Negative.   Skin: Negative.   Neurological: Negative.   Endo/Heme/Allergies: Negative.   Psychiatric/Behavioral: Positive for depression (Stable) and substance abuse (Hx. Alcohol, Benzodiazepine/THC use disorder). Negative for hallucinations, memory loss and suicidal ideas. The patient has insomnia (Stable). The patient is not nervous/anxious.     Blood pressure (!) 109/92, pulse (!) 111, temperature 98.2 F (36.8 C), temperature source Oral, resp. rate 16, height 5\' 4"  (1.626 m), weight 73 kg (161 lb).Body mass index is 27.64 kg/m.  See Md's SRA   Have you used any form of tobacco in the last 30 days? (Cigarettes, Smokeless Tobacco, Cigars, and/or Pipes): No  Has this patient used any form of tobacco in the last 30 days? (Cigarettes, Smokeless Tobacco, Cigars, and/or Pipes): No  Blood Alcohol level:  Lab Results  Component Value Date   ETH 84 (H) 06/25/2016   ETH <5 12/28/2014   Metabolic Disorder Labs:  No results found for: HGBA1C, MPG No results found for: PROLACTIN No results found for: CHOL, TRIG, HDL, CHOLHDL, VLDL, LDLCALC  See Psychiatric Specialty Exam and Suicide Risk Assessment completed by Attending Physician prior to discharge.  Discharge destination:  Home  Is patient on multiple antipsychotic therapies at discharge:  No   Has Patient had three or more failed trials of antipsychotic monotherapy by history:  No  Recommended Plan for Multiple Antipsychotic Therapies: NA  Allergies as of 06/30/2016      Reactions   Prednisone Nausea And Vomiting   Hydrocodone-acetaminophen Other (See Comments)   Latex Rash      Medication List    STOP taking these medications   amphetamine-dextroamphetamine 10 MG  tablet Commonly known as:  ADDERALL   Apple Cider Vinegar 600 MG Caps   B-12 PO   B-6 PO   co-enzyme Q-10 30 MG capsule   TURMERIC PO   vitamin C 500 MG tablet Commonly known as:  ASCORBIC ACID   zolpidem 5 MG tablet Commonly known as:  AMBIEN     TAKE these medications     Indication  albuterol 108 (90 Base) MCG/ACT inhaler Commonly known as:  PROVENTIL HFA;VENTOLIN HFA Inhale 1-2 puffs into the lungs every 6 (six) hours as needed for wheezing or shortness of breath.  Indication:  Asthma   FLUoxetine 20 MG capsule Commonly known as:  PROZAC Take 1 capsule (20 mg total) by mouth daily. For depression What changed:  medication strength  how much to take  additional instructions  Indication:  Major Depressive Disorder   LORazepam 0.5 MG tablet Commonly known as:  ATIVAN Take 1 tablet (0.5 mg total) by mouth at bedtime as needed for anxiety.  Indication:  Anxiety  Follow-up Information    [place], [facility] Follow up on 07/05/2016.   Why:  at 12:45pm with Katie for medication management.  Contact information: [address] [facility] [address] [phone]        Pt declines other mental health follow-up Follow up.          Follow-up recommendations: Activity:  As tolerated Diet: As recommended by your primary care doctor. Keep all scheduled follow-up appointments as recommended.   Comments: Patient is instructed prior to discharge to: Take all medications as prescribed by his/her mental healthcare provider. Report any adverse effects and or reactions from the medicines to his/her outpatient provider promptly. Patient has been instructed & cautioned: To not engage in alcohol and or illegal drug use while on prescription medicines. In the event of worsening symptoms, patient is instructed to call the crisis hotline, 911 and or go to the nearest ED for appropriate evaluation and treatment of symptoms. To follow-up with  his/her primary care provider for your other medical issues, concerns and or health care needs.   Signed: Sanjuana Kava, NP, PMHNP, FNP-BC 07/04/2016, 9:44 AM   Patient seen, Suicide Assessment Completed.  Disposition Plan Reviewed

## 2016-06-30 NOTE — Progress Notes (Signed)
Recreation Therapy Notes  Date: 06/30/16 Time: 0930 Location: [address] Dayroom  Group Topic: Stress Management  Goal Area(s) Addresses:  Patient will verbalize importance of using healthy stress management.  Patient will identify positive emotions associated with healthy stress management.   Intervention: Stress Management  Activity :  Guided Imagery.  LRT introduced the stress management technique of guided imagery.  LRT read a script to guide patients and engage them in the technique.  Patients were to follow along as LRT read script to fully participate in the technique.  Education:  Stress Management, Discharge Planning.   Education Outcome: Acknowledges edcuation/In group clarification offered/Needs additional education  Clinical Observations/Feedback: Pt did not attend group.   Caroll RancherMarjette Zyion Leidner, LRT/CTRS         Caroll RancherLindsay, Lou Irigoyen A 06/30/2016 12:18 PM

## 2016-06-30 NOTE — [facility] Suicide Risk Assessment (Signed)
[facility] Discharge Suicide Risk Assessment   Principal Problem: Suicidal ideations Discharge Diagnoses:  Patient Active Problem List   Diagnosis Date Noted  . Polysubstance dependence, non-opioid, continuous (HCC) [F19.20] 06/27/2016  . Suicidal ideations [R45.851] 06/27/2016  . Major depressive disorder, single episode, severe, without mention of psychotic behavior [F32.2]   . PTSD (post-traumatic stress disorder) [F43.10] 12/30/2014  . 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 Alcohol use disorder, moderate, dependence (HCC) [F10.20] 12/29/2014  . UTI (lower urinary tract infection) [N39.0] 07/21/2014  . Hypokalemia [E87.6] 07/21/2014  . Hyperglycemia [R73.9] 07/21/2014  . Pyelonephritis [N12] 07/21/2014    Total Time spent with patient: 30 minutes  Musculoskeletal: Strength & Muscle Tone: within normal limits Gait & Station: normal Patient leans: N/A  Psychiatric Specialty Exam: ROS denies headache, no chest pain, no shortness of breath   Blood pressure (!) 109/92, pulse (!) 111, temperature 98.2 F (36.8 C), temperature source Oral, resp. rate 16, height 5\' 4"  (1.626 m), weight 73 kg (161 lb).Body mass index is 27.64 kg/m.  General Appearance: Well Groomed  Eye Contact::  Good  Speech:  Normal Rate409  Volume:  Normal  Mood:  improved mood, at this time presents euthymic  Affect:  Appropriate and Full Range  Thought Process:  Linear and Descriptions of Associations: Intact  Orientation:  Full (Time, Place, and Person)  Thought Content:  denies hallucinations, no delusions, not internally preoccupied  Suicidal Thoughts:  No denies any suicidal or self injurious ideations, denies any homicidal or violent ideations  Homicidal Thoughts:  No  Memory:  recent and remote grossly intact   Judgement:  Other:  improving   Insight:  improving   Psychomotor Activity:  Normal  Concentration:  Good  Recall:  Good  Fund of Knowledge:Good   Language: Good  Akathisia:  Negative  Handed:  Right  AIMS (if indicated):     Assets:  Desire for Improvement Physical Health Resilience  Sleep:  Number of Hours: 6  Cognition: WNL  ADL's:  Intact   Mental Status Per Nursing Assessment::   On Admission:  Self-harm thoughts, Self-harm behaviors  Demographic Factors:  31 year old female , lives with her children, currently unemployed  Loss Factors: Family stressors, unemployment   Historical Factors: Prior psychiatric admissions, history of alcohol abuse, history of borderline personality disorder   Risk Reduction Factors:   Responsible for children under 77 years of age, Sense of responsibility to family, Living with another person, especially a relative and Positive coping skills or problem solving skills  Continued Clinical Symptoms:  Alert and attentive, well related, well groomed, mood improved, currently euthymic, affect appropriate, denies suicidal or self injurious ideations, no violent or homicidal ideations, no psychotic symptoms, future oriented. No current symptoms of withdrawal. Tolerating Prozac well . Denies side effects    Cognitive Features That Contribute To Risk:  No gross cognitive deficits noted upon discharge. Is alert , attentive, and oriented x 3   Suicide Risk:  Mild:  Suicidal ideation of limited frequency, intensity, duration, and specificity.  There are no identifiable plans, no associated intent, mild dysphoria and related symptoms, good self-control (both objective and subjective assessment), few other risk factors, and identifiable protective factors, including available and accessible social support.  Follow-up Information    [place], [facility] Follow up on 07/05/2016.   Why:  at 12:45pm with Katie for medication management.  Contact information: [address] [place] [address] [phone]  Plan Of Care/Follow-up recommendations:  Activity:   as tolerated  Diet:  regular Tests:  NA Other:  see below  Patient is requesting discharge and there are no current ground for involuntary commitment  She is leaving unit in good spirits  Plans to return home Encouraged to abstain from alcohol and illicit drugs.  Craige CottaFernando A Brenda Samano, MD 06/30/2016, 12:55 PM

## 2016-06-30 NOTE — Tx Team (Signed)
Interdisciplinary Treatment and Diagnostic Plan Update  06/30/2016 Time of Session: 9:30am [name] MRN: [id number]  Principal Diagnosis: Suicidal ideations  Secondary Diagnoses: Principal Problem:   Suicidal ideations Active Problems:   Polysubstance dependence, non-opioid, continuous (HCC)    Current Medications:  Current Facility-Administered Medications  Medication Dose Route Frequency Provider Last Rate Last Dose  . albuterol (PROVENTIL HFA;VENTOLIN HFA) 108 (90 Base) MCG/ACT inhaler 1-2 puff  1-2 puff Inhalation Q6H PRN Laverle Hobby, PA-C      . alum & mag hydroxide-simeth (MAALOX/MYLANTA) 200-200-20 MG/5ML suspension 30 mL  30 mL Oral Q4H PRN Laverle Hobby, PA-C      . FLUoxetine (PROZAC) capsule 20 mg  20 mg Oral Daily Cobos, Myer Peer, MD   20 mg at 06/30/16 0740  . LORazepam (ATIVAN) tablet 1 mg  1 mg Oral Daily Simon, Spencer E, PA-C      . magnesium hydroxide (MILK OF MAGNESIA) suspension 30 mL  30 mL Oral Daily PRN Laverle Hobby, PA-C      . multivitamin with minerals tablet 1 tablet  1 tablet Oral Daily Laverle Hobby, PA-C   1 tablet at 06/30/16 0740  . thiamine (B-1) injection 100 mg  100 mg Intramuscular Once Patriciaann Clan E, PA-C      . thiamine (VITAMIN B-1) tablet 100 mg  100 mg Oral Daily Laverle Hobby, PA-C   100 mg at 06/30/16 0740    PTA Medications: Prescriptions Prior to Admission  Medication Sig Dispense Refill Last Dose  . amphetamine-dextroamphetamine (ADDERALL) 10 MG tablet Take 10 mg by mouth daily with breakfast.   06/25/2016 at Unknown time  . Apple Cider Vinegar 600 MG CAPS Take 1 capsule by mouth daily.   06/24/2016 at Unknown time  . co-enzyme Q-10 30 MG capsule Take 30 mg by mouth 3 (three) times daily.   06/24/2016 at Unknown time  . Cyanocobalamin (B-12 PO) Take 1 tablet by mouth daily.   06/24/2016 at Unknown time  . FLUoxetine (PROZAC) 40 MG capsule Take 1 capsule (40 mg total) by mouth daily. (Patient not taking: Reported  on 06/25/2016) 30 capsule 0 Not Taking at Unknown time  . Pyridoxine HCl (B-6 PO) Take 1 tablet by mouth daily.   06/24/2016 at Unknown time  . TURMERIC PO Take 1 tablet by mouth daily.   06/24/2016 at Unknown time  . vitamin C (ASCORBIC ACID) 500 MG tablet Take 500 mg by mouth daily.   06/24/2016 at Unknown time  . zolpidem (AMBIEN) 5 MG tablet Take 1 tablet (5 mg total) by mouth at bedtime. 30 tablet 0 06/24/2016 at Unknown time  . [DISCONTINUED] albuterol (PROVENTIL HFA;VENTOLIN HFA) 108 (90 Base) MCG/ACT inhaler Inhale 1-2 puffs into the lungs every 6 (six) hours as needed for wheezing or shortness of breath.    Unknown at Unknown time  . [DISCONTINUED] LORazepam (ATIVAN) 0.5 MG tablet Take 1 tablet (0.5 mg total) by mouth at bedtime as needed for anxiety. 14 tablet 0 06/24/2016 at Unknown time    Treatment Modalities: Medication Management, Group therapy, Case management,  1 to 1 session with clinician, Psychoeducation, Recreational therapy.  Patient Stressors: Financial difficulties Marital or family conflict Medication change or noncompliance Substance abuse  Patient Strengths: Average or above average intelligence Capable of independent living General fund of knowledge Supportive family/friends  Physician Treatment Plan for Primary Diagnosis: Suicidal ideations Long Term Goal(s): Improvement in symptoms so as ready for discharge  Short Term Goals: Ability to identify  changes in lifestyle to reduce recurrence of condition will improve Ability to verbalize feelings will improve Ability to disclose and discuss suicidal ideas Ability to demonstrate self-control will improve Ability to identify and develop effective coping behaviors will improve Compliance with prescribed medications will improve Ability to identify triggers associated with substance abuse/mental health issues will improve  Medication Management: Evaluate patient's response, side effects, and tolerance of medication  regimen.  Therapeutic Interventions: 1 to 1 sessions, Unit Group sessions and Medication administration.  Evaluation of Outcomes: Not Met  Physician Treatment Plan for Secondary Diagnosis: Principal Problem:   Suicidal ideations Active Problems:   Polysubstance dependence, non-opioid, continuous ([place])   Long Term Goal(s): Improvement in symptoms so as ready for discharge  Short Term Goals: Ability to identify changes in lifestyle to reduce recurrence of condition will improve Ability to verbalize feelings will improve Ability to disclose and discuss suicidal ideas Ability to demonstrate self-control will improve Ability to identify and develop effective coping behaviors will improve Compliance with prescribed medications will improve Ability to identify triggers associated with substance abuse/mental health issues will improve  Medication Management: Evaluate patient's response, side effects, and tolerance of medication regimen.  Therapeutic Interventions: 1 to 1 sessions, Unit Group sessions and Medication administration.  Evaluation of Outcomes: Adequate for Discharge   RN Treatment Plan for Primary Diagnosis: Suicidal ideations Long Term Goal(s): Knowledge of disease and therapeutic regimen to maintain health will improve  Short Term Goals: Ability to verbalize feelings will improve, Ability to disclose and discuss suicidal ideas and Ability to identify and develop effective coping behaviors will improve  Medication Management: RN will administer medications as ordered by provider, will assess and evaluate patient's response and provide education to patient for prescribed medication. RN will report any adverse and/or side effects to prescribing provider.  Therapeutic Interventions: 1 on 1 counseling sessions, Psychoeducation, Medication administration, Evaluate responses to treatment, Monitor vital signs and CBGs as ordered, Perform/monitor CIWA, COWS, AIMS and Fall Risk  screenings as ordered, Perform wound care treatments as ordered.  Evaluation of Outcomes: Adequate for Discharge   LCSW Treatment Plan for Primary Diagnosis: Suicidal ideations Long Term Goal(s): Safe transition to appropriate next level of care at discharge, Engage patient in therapeutic group addressing interpersonal concerns.  Short Term Goals: Engage patient in aftercare planning with referrals and resources, Identify triggers associated with mental health/substance abuse issues and Increase skills for wellness and recovery  Therapeutic Interventions: Assess for all discharge needs, 1 to 1 time with Social worker, Explore available resources and support systems, Assess for adequacy in community support network, Educate family and significant other(s) on suicide prevention, Complete Psychosocial Assessment, Interpersonal group therapy.  Evaluation of Outcomes: Adequate for Discharge   Progress in Treatment: Attending groups: Pt is new to milieu, continuing to assess  Participating in groups: Pt is new to milieu, continuing to assess  Taking medication as prescribed: Yes, MD continues to assess for medication changes as needed Toleration medication: Yes, no side effects reported at this time Family/Significant other contact made: No, Pt declines Patient understands diagnosis: Continuing to assess Discussing patient identified problems/goals with staff: Yes Medical problems stabilized or resolved: Yes Denies suicidal/homicidal ideation: Yes Issues/concerns per patient self-inventory: None Other: N/A  New problem(s) identified: None identified at this time.   New Short Term/Long Term Goal(s): None identified at this time.   Discharge Plan or Barriers: Pt will return home. Currently declining referral for mental health services and reports that she will continue to follow-up with  PCP for medications.  Reason for Continuation of Hospitalization: Anxiety Depression Medication  stabilization Withdrawal symptoms  Estimated Length of Stay: 2-3 days  Attendees: Patient: 06/30/2016  11:37 AM  Physician: Dr. Shea Evans; Dr. Parke Poisson 06/30/2016  11:37 AM  Nursing: Kieth Brightly, RN 06/30/2016  11:37 AM  RN Care Manager: Lars Pinks, RN 06/30/2016  11:37 AM  Social Worker: Dejaun Vidrio L Demitrious Mccannon LCSWA; Matthew Saras, Latanya Presser 06/30/2016  11:37 AM  Recreational Therapist:  06/30/2016  11:37 AM  Other: Lindell Spar, NP; Samuel Jester, NP 06/30/2016  11:37 AM  Other:  06/30/2016  11:37 AM  Other: 06/30/2016  11:37 AM    Scribe for Treatment Team: Gladstone Lighter, LCSW 06/30/2016 11:37 AM

## 2016-06-30 NOTE — Plan of Care (Signed)
Problem: Self-Concept: Goal: Ability to verbalize positive feelings about self will improve Outcome: Progressing Ability to verbalize positive feelings about self will improve AEB Client denies SI, "I made some bad choices, that not really how I felt"

## 2016-06-30 NOTE — Progress Notes (Signed)
Nursing Discharge Note 06/30/2016 [id number]  Data Reports sleeping fair with PRN sleep med.  Rates depression 0/10, hopelessness 1/10, and anxiety 1/10. Affect wide-ranged and appropriate mood euthymic.  Denies HI, SI, AVH.  Patient did not attend groups in AM, rested in room.  Interacting appropriately with staff and peers.  Received discharge orders.  Action Spoke with patient 1:1, nurse offered support to patient throughout shift.  Reviewed medications, discharge instructions, and follow up appointments with patient. Medication scripts reviewed and given to patient.  Paperwork, AVS, SRA, and transition record handed to patient.   Escorted off of unit at 1350. Belongings returned per belongings form.  Discharged to lobby, stated she had her car in the parking lot and she would drive herself home.    Response Verbalized understanding of discharge teaching. Agrees to contact someone or 911 with thoughts/intent to harm self or others.    To follow up per AVS.

## 2016-07-09 ENCOUNTER — Encounter (HOSPITAL_COMMUNITY): Payer: Self-pay | Admitting: Behavioral Health

## 2016-07-09 ENCOUNTER — Emergency Department (HOSPITAL_COMMUNITY)
Admission: EM | Admit: 2016-07-09 | Discharge: 2016-07-10 | Disposition: A | Discharge status: Home / Self Care | Payer: Medicaid Other | Attending: Emergency Medicine | Admitting: Emergency Medicine

## 2016-07-09 DIAGNOSIS — J45909 Unspecified asthma, uncomplicated: Secondary | ICD-10-CM | POA: Insufficient documentation

## 2016-07-09 DIAGNOSIS — Z79899 Other long term (current) drug therapy: Secondary | ICD-10-CM | POA: Diagnosis not present

## 2016-07-09 DIAGNOSIS — F1020 Alcohol dependence, uncomplicated: Secondary | ICD-10-CM | POA: Diagnosis present

## 2016-07-09 DIAGNOSIS — E876 Hypokalemia: Secondary | ICD-10-CM | POA: Diagnosis not present

## 2016-07-09 DIAGNOSIS — F10120 Alcohol abuse with intoxication, uncomplicated: Secondary | ICD-10-CM | POA: Diagnosis not present

## 2016-07-09 DIAGNOSIS — R45851 Suicidal ideations: Secondary | ICD-10-CM

## 2016-07-09 DIAGNOSIS — F1290 Cannabis use, unspecified, uncomplicated: Secondary | ICD-10-CM | POA: Diagnosis not present

## 2016-07-09 DIAGNOSIS — F1390 Sedative, hypnotic, or anxiolytic use, unspecified, uncomplicated: Secondary | ICD-10-CM | POA: Diagnosis not present

## 2016-07-09 DIAGNOSIS — F10920 Alcohol use, unspecified with intoxication, uncomplicated: Secondary | ICD-10-CM

## 2016-07-09 DIAGNOSIS — R451 Restlessness and agitation: Secondary | ICD-10-CM

## 2016-07-09 DIAGNOSIS — Z87891 Personal history of nicotine dependence: Secondary | ICD-10-CM | POA: Diagnosis not present

## 2016-07-09 DIAGNOSIS — F1920 Other psychoactive substance dependence, uncomplicated: Secondary | ICD-10-CM | POA: Diagnosis not present

## 2016-07-09 LAB — COMPREHENSIVE METABOLIC PANEL
ALT: 39 U/L (ref 14–54)
AST: 37 U/L (ref 15–41)
Albumin: 4.1 g/dL (ref 3.5–5.0)
Alkaline Phosphatase: 65 U/L (ref 38–126)
Anion gap: 14 (ref 5–15)
BUN: 9 mg/dL (ref 6–20)
CO2: 21 mmol/L — ABNORMAL LOW (ref 22–32)
Calcium: 8.9 mg/dL (ref 8.9–10.3)
Chloride: 108 mmol/L (ref 101–111)
Creatinine, Ser: 0.71 mg/dL (ref 0.44–1.00)
GFR calc Af Amer: >60 mL/min (ref >60)
GFR calc non Af Amer: >60 mL/min (ref >60)
Glucose, Bld: 105 mg/dL — ABNORMAL HIGH (ref 65–99)
Potassium: 3 mmol/L — ABNORMAL LOW (ref 3.5–5.1)
Sodium: 143 mmol/L (ref 135–145)
Total Bilirubin: 0.2 mg/dL — ABNORMAL LOW (ref 0.3–1.2)
Total Protein: 7.6 g/dL (ref 6.5–8.1)

## 2016-07-09 LAB — RAPID URINE DRUG SCREEN, HOSP PERFORMED
Amphetamines: NOT DETECTED
Barbiturates: NOT DETECTED
Benzodiazepines: POSITIVE — AB
Cocaine: NOT DETECTED
Opiates: NOT DETECTED
Tetrahydrocannabinol: POSITIVE — AB

## 2016-07-09 LAB — CBC WITH DIFFERENTIAL/PLATELET
Basophils Absolute: 0 10*3/uL (ref 0.0–0.1)
Basophils Relative: 0 %
Eosinophils Absolute: 0 10*3/uL (ref 0.0–0.7)
Eosinophils Relative: 1 %
HCT: 41.5 % (ref 36.0–46.0)
Hemoglobin: 14.1 g/dL (ref 12.0–15.0)
Lymphocytes Relative: 24 %
Lymphs Abs: 1.3 10*3/uL (ref 0.7–4.0)
MCH: 30.1 pg (ref 26.0–34.0)
MCHC: 34 g/dL (ref 30.0–36.0)
MCV: 88.5 fL (ref 78.0–100.0)
Monocytes Absolute: 0.3 10*3/uL (ref 0.1–1.0)
Monocytes Relative: 5 %
Neutro Abs: 3.6 10*3/uL (ref 1.7–7.7)
Neutrophils Relative %: 70 %
Platelets: 241 10*3/uL (ref 150–400)
RBC: 4.69 MIL/uL (ref 3.87–5.11)
RDW: 13.2 % (ref 11.5–15.5)
WBC: 5.2 10*3/uL (ref 4.0–10.5)

## 2016-07-09 LAB — SALICYLATE LEVEL: Salicylate Lvl: <7 mg/dL (ref 2.8–30.0)

## 2016-07-09 LAB — ACETAMINOPHEN LEVEL: Acetaminophen (Tylenol), Serum: <10 ug/mL — ABNORMAL LOW (ref 10–30)

## 2016-07-09 LAB — ETHANOL: Alcohol, Ethyl (B): 300 mg/dL — ABNORMAL HIGH (ref <5)

## 2016-07-09 LAB — PREGNANCY, URINE: Preg Test, Ur: NEGATIVE

## 2016-07-09 MED ORDER — IBUPROFEN 400 MG PO TABS: 600.0000 mg | ORAL_TABLET | Freq: Three times a day (TID) | ORAL | Status: DC | PRN

## 2016-07-09 MED ORDER — LORAZEPAM 2 MG/ML IJ SOLN: 0.0000 mg | Freq: Two times a day (BID) | INTRAMUSCULAR | Status: DC

## 2016-07-09 MED ORDER — LORAZEPAM 1 MG PO TABS: 0.0000 mg | ORAL_TABLET | Freq: Two times a day (BID) | ORAL | Status: DC

## 2016-07-09 MED ORDER — LORAZEPAM 1 MG PO TABS
0.0000 mg | ORAL_TABLET | Freq: Four times a day (QID) | ORAL | Status: DC
  Administered 2016-07-09 – 2016-07-10 (×2): 1 mg via ORAL
  Filled 2016-07-09 (×2): qty 1

## 2016-07-09 MED ORDER — ALUM & MAG HYDROXIDE-SIMETH 200-200-20 MG/5ML PO SUSP: 30.0000 mL | Freq: Four times a day (QID) | ORAL | Status: DC | PRN

## 2016-07-09 MED ORDER — ZIPRASIDONE MESYLATE 20 MG IM SOLR
10.0000 mg | Freq: Once | INTRAMUSCULAR | Status: AC
  Administered 2016-07-09: 10 mg via INTRAMUSCULAR
  Filled 2016-07-09: qty 20

## 2016-07-09 MED ORDER — ONDANSETRON HCL 4 MG PO TABS: 4.0000 mg | ORAL_TABLET | Freq: Three times a day (TID) | ORAL | Status: DC | PRN

## 2016-07-09 MED ORDER — ACETAMINOPHEN 325 MG PO TABS: 650.0000 mg | ORAL_TABLET | ORAL | Status: DC | PRN

## 2016-07-09 MED ORDER — ZOLPIDEM TARTRATE 5 MG PO TABS: 5.0000 mg | ORAL_TABLET | Freq: Every evening | ORAL | Status: DC | PRN

## 2016-07-09 MED ORDER — VITAMIN B-1 100 MG PO TABS
100.0000 mg | ORAL_TABLET | Freq: Every day | ORAL | Status: DC
  Administered 2016-07-09 – 2016-07-10 (×2): 100 mg via ORAL
  Filled 2016-07-09 (×2): qty 1

## 2016-07-09 MED ORDER — THIAMINE HCL 100 MG/ML IJ SOLN: 100.0000 mg | Freq: Every day | INTRAMUSCULAR | Status: DC

## 2016-07-09 MED ORDER — LORAZEPAM 2 MG/ML IJ SOLN
0.0000 mg | Freq: Four times a day (QID) | INTRAMUSCULAR | Status: DC
  Filled 2016-07-09: qty 1

## 2016-07-09 MED ORDER — POTASSIUM CHLORIDE CRYS ER 20 MEQ PO TBCR: 40.0000 meq | EXTENDED_RELEASE_TABLET | ORAL | Status: AC

## 2016-07-09 NOTE — ED Notes (Signed)
Pt unable to complete TTS due to sedation.

## 2016-07-09 NOTE — ED Notes (Signed)
[facility] per Dr Mike GipMcM request- per unit sec, Leotis Shames(Lauren B), she was told no psych avail, and no admin on call- Dr Mike GipMcM apprised- Dr Clarene DukeMcManus then [facility] with [facility]

## 2016-07-09 NOTE — [facility] Counselor (Signed)
Counselor corresponded with Patient's RN, Thurston HoleAnne T.  Counselor informed that Patient was still asleep.  RN reported future contact with TTS once Patient woke up.  Elmore GuiseJaniah Donya Tomaro, LPCA & LCAS Therapeutic Triage Specialist [phone]

## 2016-07-09 NOTE — ED Notes (Signed)
Pt unable to do TTS assessment.  Pt had 10 mg of Geodon at 0321.  Pt will not wake up.

## 2016-07-09 NOTE — ED Notes (Signed)
[facility] informed that pt continues to sleep and will call when she awakens

## 2016-07-09 NOTE — Progress Notes (Signed)
  The TTS department has  received multiple telephone calls from the staff RN at [facility] today requesting this NP to speak with the Pt's mother and most recently to  Dr Clarene DukeMcManus. The Pt's mother does not want her daughter discharged because of statements she says her daughter  posted on FB about killing herself and her children. The Pt was found at a bar last night extremely intoxicated and brought to [facility] via EMS.  This NP spoke with Dr Clarene DukeMcManus and explained that the Pt is an adult and when she was assessed by TTS, she denied suicidal/homicidal ideation and auditory and visual hallucinations. Pt was psychiatrically cleared for discharge based on her statements and presentation to TTS.  Dr Clarene DukeMcManus then requested this writer speak to the staff RN who has been interacting with the Pt's mother. The RN asked this writer if we had gone on FB to view the post the Pt wrote. This Clinical research associatewriter said no we don't go on FB and that would be an invasion of privacy. This Clinical research associatewriter explained the same, as above, to the Engineer, drillingtaff RN. The Pt does not meet criteria for inpatient and we can not go by what the Pt's mother is saying because the Pt is an adult. Advised that the Pt's mother call DSS for the children to be removed and the police if the Pt's mother is in fear for her life or the Pt's life and have her IVC'd and brought back to the emergency room.  Charge [facility] then [facility] and spoke with Lillia AbedLindsay, [facility] and stated that Dr Clarene DukeMcManus wanted to speak to her. Lillia AbedLindsay explained the process of how TTS functions to the charge nurse and Dr Clarene DukeMcManus. Dr Clarene DukeMcManus requested that the Pt be tele psyched again in the A.M.    Laura AbbeLaurie Britton Al Gagen, NP-C 07/09/16   1818

## 2016-07-09 NOTE — ED Provider Notes (Signed)
TTS has evaluated pt: states pt can be d/c. Pt's mother came to the ED, states pt posted on social media (Facebook) that pt plans self harm, told EDP last evening that pt threatened to kill herself and her children. Mother unhappy pt was being discharged; [facility] Psych NP Lawson FiscalLori states they are unable to speak to pt's mother due to pt being sober and is above age of 11majority. [facility]: states no psychiatrist on call for TTS evaluations, only for egregious issues, complex medications issues, etc. Given mother's statements, I am concerned about discharging pt.  [facility] states they will have Psych team re-evaluate pt tomorrow morning. Will hold in ED overnight.    Samuel JesterMcManus, Victora Irby, DO 07/09/16 1759

## 2016-07-09 NOTE — ED Notes (Signed)
Per mother, daughter has posted on facebook that she plans to self harm-  Mother states she was here last PM and told physician pt has threatened self and to kill her children.   [facility] to share infor with evaluator and [facility]. Due to previous evaluation to release, and pt is currently sober, and of age, (per BraceyJenea, POC) the NP on call cannot speak to mother.

## 2016-07-09 NOTE — ED Notes (Signed)
Mother in to see patient- She reports daughter has threatened to kill both herself and children - She is unhappy that her daughter is being discharged. She is told that pt has been evaluated and pt report and her reports are not congruent. [facility] called that she might speak with persons there

## 2016-07-09 NOTE — [facility] Counselor (Signed)
Per attending physician's request, Pt to have AM psych eval.

## 2016-07-09 NOTE — ED Notes (Signed)
[facility] to be [facility]-

## 2016-07-09 NOTE — ED Notes (Signed)
Pt mother questions answered regarding daughter  Education suggestions:  When hear pt threaten children - call DSS and ask for emergency intervention for children from CPS  If pt threatens mother, call police and for mother to go to magistrate and fill out IVC as first person  Encourage daughter to [facility] services while she is sober and/or to suggest counseling

## 2016-07-09 NOTE — ED Notes (Signed)
Meal provided Pt awakened - she is lucid and answers questions appropriately

## 2016-07-09 NOTE — ED Triage Notes (Signed)
Pt brought in by [facility] for c/o of initial unresponsiveness; when ems arrived on scene was not alert and unresponsive to painful stimuli; pt aroused with ammonia inhalent; pt was very irate and combative in the truck; pt has slurred speech; pt give 5mg  of versed IM en route to hospital. Law enforcement with with pt at this time
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
## 2016-07-09 NOTE — ED Notes (Signed)
Pure Wick catheter placed on pt.

## 2016-07-09 NOTE — ED Provider Notes (Addendum)
[facility] Provider Note   CSN: [id number] Arrival date & time: 07/09/16  0250     History   Chief Complaint Chief Complaint  Patient presents with  . Medical Clearance    HPI [name] is a 31 y.o. female.  The history is provided by the EMS personnel and a parent. The history is limited by the condition of the patient (Agitated, uncooperative, intoxicated).  She was brought in by ambulance after a call was received from a bar where she had passed out. However, after getting in the ambulance, patient became extremely agitated and required sedation with midazolam. Unfortunately, that did not effectively sedate her. She admits to drinking tonight, but states it was not "that much". She admits to depression and suicidal ideation. She states that she would cut her wrist and she has had similar attempts in the past. She is abusive and cursing at staff. Her mother has come in and states that she has a history of post medics stress disorder and depression and suicide attempts. She had just been discharged from [facility] one week ago. She was better on discharge, but immediately went back to drinking. She has had posts on social media stating her suicidal intent.  Past Medical History:  Diagnosis Date  . Anxiety   . Asthma    no current med.  . Depression   . Hand laceration involving tendon 12/13/2014   right  . Headache   . Open metacarpal fracture 12/13/2014   right  . PTSD (post-traumatic stress disorder)     Patient Active Problem List   Diagnosis Date Noted  . Polysubstance dependence, non-opioid, continuous (HCC) 06/27/2016  . Suicidal ideations 06/27/2016  . Major depressive disorder, single episode, severe, without mention of psychotic behavior   . PTSD (post-traumatic stress disorder) 12/30/2014  . 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 Alcohol use disorder, moderate, dependence (HCC) 12/29/2014    . UTI (lower urinary tract infection) 07/21/2014  . Hypokalemia 07/21/2014  . Hyperglycemia 07/21/2014  . Pyelonephritis 07/21/2014    Past Surgical History:  Procedure Laterality Date  . BONE TUMOR EXCISION Left    great toe  . CESAREAN SECTION  03/02/2013  . DILATION AND CURETTAGE OF UTERUS  01/12/2006   suction D & C  . HAND SURGERY     right  . I&D EXTREMITY Right 12/17/2014   Procedure: RIGHT HAND IRRIGATION AND DEBRIDEMENT,;  Surgeon: Betha Loa, MD;  Location: [facility];  Service: Orthopedics;  Laterality: Right;  . REPAIR EXTENSOR TENDON Right 12/17/2014   Procedure: REPAIR EXTENSOR TENDON;  Surgeon: Betha Loa, MD;  Location: [facility];  Service: Orthopedics;  Laterality: Right;  . TUBAL LIGATION    . WISDOM TOOTH EXTRACTION      OB History    Gravida Para Term Preterm AB Living   4 2 2   1 3    SAB TAB Ectopic Multiple Live Births   1       2       Home Medications    Prior to Admission medications   Medication Sig Start Date End Date Taking? Authorizing Provider  albuterol (PROVENTIL HFA;VENTOLIN HFA) 108 (90 Base) MCG/ACT inhaler Inhale 1-2 puffs into the lungs every 6 (six) hours as needed for wheezing or shortness of breath. 06/30/16 06/30/17  Armandina Stammer I, NP  FLUoxetine (PROZAC) 20 MG capsule Take 1 capsule (20 mg total) by mouth daily. For depression 07/01/16  Armandina StammerNwoko, Agnes I, NP  LORazepam (ATIVAN) 0.5 MG tablet Take 1 tablet (0.5 mg total) by mouth at bedtime as needed for anxiety. 06/30/16   Sanjuana KavaNwoko, Agnes I, NP    Family History Family History  Problem Relation Age of Onset  . Hypertension Father     Social History Social History  Substance Use Topics  . Smoking status: Former Smoker    Packs/day: 0.00    Years: 0.00    Types: Cigarettes    Quit date: 02/12/2006  . Smokeless tobacco: Never Used  . Alcohol use 0.0 oz/week     Comment: occasionally     Allergies   Prednisone; Hydrocodone-acetaminophen; and  Latex   Review of Systems Review of Systems  Unable to perform ROS: Psychiatric disorder     Physical Exam Updated Vital Signs BP 123/76 (BP Location: Left Arm)   Pulse (!) 126   Resp (!) 22   Ht 5\' 3"  (1.6 m)   Wt 68 kg (150 lb)   SpO2 98%   BMI 26.57 kg/m   Physical Exam  Nursing note and vitals reviewed.  31 year old female, agitated and cursing at staff and requiring physical restraints. Vital signs are significant for tachycardia and tachypnea. Oxygen saturation is 98%, which is normal. Head is normocephalic and atraumatic. PERRLA, EOMI. Oropharynx is clear. Neck is nontender and supple without adenopathy or JVD. Back is nontender and there is no CVA tenderness. Lungs are clear without rales, wheezes, or rhonchi. Chest is nontender. Heart has regular rate and rhythm without murmur. Abdomen is soft, flat, nontender without masses or hepatosplenomegaly and peristalsis is normoactive. Extremities have no cyanosis or edema, full range of motion is present. Multiple scars are present on the flexor surface of the left forearm from prior self-mutilation. Skin is warm and dry without rash. Neurologic: Mental status is as noted above, cranial nerves are intact, there are no motor or sensory deficits.  ED Treatments / Results  Labs (all labs ordered are listed, but only abnormal results are displayed) Labs Reviewed  COMPREHENSIVE METABOLIC PANEL - Abnormal; Notable for the following:       Result Value   Potassium 3.0 (*)    CO2 21 (*)    Glucose, Bld 105 (*)    Total Bilirubin 0.2 (*)    All other components within normal limits  ETHANOL - Abnormal; Notable for the following:    Alcohol, Ethyl (B) 300 (*)    All other components within normal limits  ACETAMINOPHEN LEVEL - Abnormal; Notable for the following:    Acetaminophen (Tylenol), Serum <10 (*)    All other components within normal limits  RAPID URINE DRUG SCREEN, HOSP PERFORMED - Abnormal; Notable for the  following:    Benzodiazepines POSITIVE (*)    Tetrahydrocannabinol POSITIVE (*)    All other components within normal limits  CBC WITH DIFFERENTIAL/PLATELET  SALICYLATE LEVEL  PREGNANCY, URINE    Procedures Procedures (including critical care time) CRITICAL CARE Performed by: ZOXWR,UEAVWGLICK,Adhrit Krenz Total critical care time: 50 minutes Critical care time was exclusive of separately billable procedures and treating other patients. Critical care was necessary to treat or prevent imminent or life-threatening deterioration. Critical care was time spent personally by me on the following activities: development of treatment plan with patient and/or surrogate as well as nursing, discussions with consultants, evaluation of patient's response to treatment, examination of patient, obtaining history from patient or surrogate, ordering and performing treatments and interventions, ordering and review of laboratory studies, ordering and  review of radiographic studies, pulse oximetry and re-evaluation of patient's condition.  Medications Ordered in ED Medications  potassium chloride SA (K-DUR,KLOR-CON) CR tablet 40 mEq (not administered)  zolpidem (AMBIEN) tablet 5 mg (not administered)  ondansetron (ZOFRAN) tablet 4 mg (not administered)  ibuprofen (ADVIL,MOTRIN) tablet 600 mg (not administered)  alum & mag hydroxide-simeth (MAALOX/MYLANTA) 200-200-20 MG/5ML suspension 30 mL (not administered)  acetaminophen (TYLENOL) tablet 650 mg (not administered)  ziprasidone (GEODON) injection 10 mg (10 mg Intramuscular Given 07/09/16 0321)     Initial Impression / Assessment and Plan / ED Course  I have reviewed the triage vital signs and the nursing notes.  Pertinent lab results that were available during my care of the patient were reviewed by me and considered in my medical decision making (see chart for details).  Agitation which is probably related to alcohol intoxication. Major depression with suicidal ideation.  Old records are reviewed confirming prior and recent hospitalizations at behavioral health hospital for depression. Because of agitation and need for physical restraints, she is given ziprasidone for protection and protection of staff. Screening labs are obtained and consultation will be obtained with TTS.  Potassium is come back low, so oral potassium replacement has been ordered. Following this opacity on, she is calm but still able to hold conversations. She is placed in psychiatric holding. Involuntary commitment papers have been filed. TTS consultation is pending.  Final Clinical Impressions(s) / ED Diagnoses   Final diagnoses:  Suicidal ideation  Agitation  Alcohol intoxication, uncomplicated (HCC)  Hypokalemia    New Prescriptions New Prescriptions   No medications on file     Dione Booze, MD 07/09/16 1610    Dione Booze, MD 07/09/16 [phone]

## 2016-07-09 NOTE — ED Notes (Signed)
[facility] Dr Mike Gip[facility]M- RN asked to speak with NP regarding note of mother's remarks- Per NP, pt was coherent when spoken to by assessment and denies self harm or harm to others- Pt reports no memory of events that led her here- When speaking to NP, remarks that mother made were discounted due to pt being an adult and [facility]. This RN told NP that she would not be able to change or delete note of mother, nor the advice. NP agreed.

## 2016-07-09 NOTE — BH Assessment (Signed)
Request for TTS assessment placed at 0425. ED staff confirmed the patient is unable to participate in assessment at this time. At 0329 patient had an alcohol level of 300.

## 2016-07-09 NOTE — ED Notes (Signed)
Pt did not eat meal- however, drank sprite

## 2016-07-09 NOTE — ED Notes (Signed)
Pt stating to this nurse "I know that I drink too much. When I do drink a lot I know sometimes I get out of control and forget what happened the next morning." Pt stating that "I have tried to stop before but not for long."

## 2016-07-09 NOTE — [facility] Counselor (Signed)
Counselor attempted to complete tele-assessment with Patient. Annice PihJackie, RN reported that Patient would not wake up and was unable to communicate when attempts where made to wake her up.    Elmore GuiseJaniah Jaydalee Bardwell, LPCA, LCAS Therapeutic Triage Specialist [phone]

## 2016-07-10 DIAGNOSIS — F1920 Other psychoactive substance dependence, uncomplicated: Secondary | ICD-10-CM | POA: Diagnosis not present

## 2016-07-10 DIAGNOSIS — F10920 Alcohol use, unspecified with intoxication, uncomplicated: Secondary | ICD-10-CM

## 2016-07-10 DIAGNOSIS — F1390 Sedative, hypnotic, or anxiolytic use, unspecified, uncomplicated: Secondary | ICD-10-CM

## 2016-07-10 DIAGNOSIS — F1290 Cannabis use, unspecified, uncomplicated: Secondary | ICD-10-CM

## 2016-07-10 NOTE — Discharge Instructions (Signed)
Have potassium rechecked later this week. Return or call for help for thoughts of self harm or to harm others.

## 2016-07-10 NOTE — ED Notes (Signed)
Dr Jodi Mourningzavitz made aware that [facility] and [facility] Pt given belongings
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
## 2016-07-10 NOTE — ED Provider Notes (Signed)
Patient observed overnight and reassessed by psychiatry/behavioral health. Patient is not homicidal or suicidal. Patient does not meet inpatient criteria per psychiatry. Patient stable for close outpatient follow.  [name], Quincy SimmondsJOSHUA M    [name], MD 07/10/16 [phone]

## 2016-07-10 NOTE — Consult Note (Signed)
Telepsych Consultation   Reason for Consult:  Depression, Acute alcohol intoxication Referring Physician:  Dr. Thurnell Garbe  Patient Identification: [name] MRN:  [id number] Principal Diagnosis: Alcohol use disorder, moderate, dependence ([place]) Diagnosis:   Patient Active Problem List   Diagnosis Date Noted  . Alcohol intoxication, uncomplicated ([place]) [I14.431]   . Polysubstance dependence, non-opioid, continuous ([place]) [F19.20] 06/27/2016  . Suicidal ideations [R45.851] 06/27/2016  . Major depressive disorder, single episode, severe, without mention of psychotic behavior [F32.2]   . PTSD (post-traumatic stress disorder) [F43.10] 12/30/2014  . Severe major depression without psychotic features ([place]) [F32.2] 12/29/2014  . Alcohol use disorder, moderate, dependence ([place]) [F10.20] 12/29/2014  . UTI (lower urinary tract infection) [N39.0] 07/21/2014  . Hypokalemia [E87.6] 07/21/2014  . Hyperglycemia [R73.9] 07/21/2014  . Pyelonephritis [N12] 07/21/2014    Total Time spent with patient: 20 minutes  Subjective:   [name] is a 31 y.o. female patient admitted to the [facility] via EMS after a call was received that patient was passed out at a bar.    HPI:    Per initial [facility] Assessment on 07/09/2016:   [name] [place] is a 31 y.o. female who presented to [place] in a state of intoxication (BAL was 300; UDS was + benzos and marijuana).  Per hospital report, Pt passed out in a local bar. An ambulance was called.  On being put in the ambulance, Pt became agitated and belligerent.  EMT administered sedative without effect.  Once at hospital, Pt told staff that she was depressed and had suicidal ideation.  Pt was recently discharged from [facility] where she was treated for cutting her wrist.  TTS attempted to assess Pt earlier in the day, but Pt was unconscious and unable to be assessed.  Pt provided history.  She reported that she remembers drinking at a bar "and that's it."  She denied suicidal  ideation, past suicide attempts, homicidal ideation, and auditory/visual hallucination.  Likewise, Pt denied a desire to self-injure.  When asked about Pt's current treatment plan, she stated that she is trying to get into outpatient therapy.  Per history, Pt has been treated inpatient several times at [facility] and other facilities for depression and substance use.  During assessment, Pt was drowsy but oriented.  She had fair eye contact and was cooperative.  Pt's was dressed in scrubs and appeared disheveled.  Pt's mood was ambivalent.  Affect was blunted.  Pt denied current suicidal ideation or other depressive symptoms.  Pt admitted to consuming alcohol but was not sure of the amount.  She stated that she tested positive for benzos because she is prescribed Ativan by a PCP.  Pt's speech was normal in rate, rhythm, and volume.  Pt's thought processes were within normal range, and thought content was logical and goal-oriented.    Per psychiatric assessment 07/10/2016 the patient continued to deny suicidal or homicidal ideation. She stated "My mother has been staying with me since my release from the hospital. I'm used to being independent. I went out to drink Saturday because my mother is my biggest trigger. I don't want to hurt myself. That would just make everything worse. I don't remember posting anything bad on facebook. I just posted that depression is a difficult illness to live with. I would never hurt anyone else either. I go to my PCP for medication management because the closest place is like forty minutes away. That just makes depression worse. I have been referred to [facility] for psychotherapy. I  just need to take prozac for my depression. I would like to get back home. I don't think coming back into the hospital would solve anything. They just wanted to put me on multiple medications that I don't want."  Case discussed with treatment team and Dr. Dwyane Dee. Patient has history of Borderline Personality  Disorder. She was recently discharged from [facility] on 06/30/2016 after making cuts to her wrists and posting suicidal content on facebook. Patient was also assessed yesterday 07/09/2016 and was found to not meet criteria for inpatient admission. Today after morning psych evaluation the patient continues to not meet criteria for inpatient placement. The patient is very future oriented talking about the need to take care of her three children and desire to start psychotherapy. [place] engaged easily during the assessment noting to smile when discussing her three children. [place] denied any intent to harm self or others several times during the assessment.    Past Psychiatric History: Depression, PTSD, Alcohol Use Disorder   Risk to Self: Suicidal Ideation: No-Not Currently/Within Last 6 Months Suicidal Intent: No-Not Currently/Within Last 6 Months Is patient at risk for suicide?: No Suicidal Plan?: No-Not Currently/Within Last 6 Months Access to Means: No What has been your use of drugs/alcohol within the last 12 months?: Marijuana, alcohol Other Self Harm Risks: Cutting behavior Triggers for Past Attempts: Unknown Intentional Self Injurious Behavior: Cutting Comment - Self Injurious Behavior: Recent cutting behavior Risk to Others: Homicidal Ideation: No Thoughts of Harm to Others: No Current Homicidal Intent: No Current Homicidal Plan: No Access to Homicidal Means: No History of harm to others?: No Assessment of Violence: None Noted Does patient have access to weapons?: No Criminal Charges Pending?: No Does patient have a court date: No Prior Inpatient Therapy: Prior Inpatient Therapy: Yes Prior Therapy Dates: 2016, 2006 Prior Therapy Facilty/Provider(s): [facility] Reason for Treatment: SI/S.A. Prior Outpatient Therapy: Prior Outpatient Therapy: No Prior Therapy Dates: n/a Prior Therapy Facilty/Provider(s): n/a Reason for Treatment: n/a Does patient have an ACCT team?: No Does  patient have Intensive In-House Services?  : No Does patient have [facility] services? : No Does patient have P4CC services?: No  Past Medical History:  Past Medical History:  Diagnosis Date  . Anxiety   . Asthma    no current med.  . Depression   . Hand laceration involving tendon 12/13/2014   right  . Headache   . Open metacarpal fracture 12/13/2014   right  . PTSD (post-traumatic stress disorder)     Past Surgical History:  Procedure Laterality Date  . BONE TUMOR EXCISION Left    great toe  . CESAREAN SECTION  03/02/2013  . DILATION AND CURETTAGE OF UTERUS  01/12/2006   suction D & C  . HAND SURGERY     right  . I&D EXTREMITY Right 12/17/2014   Procedure: RIGHT HAND IRRIGATION AND DEBRIDEMENT,;  Surgeon: Leanora Cover, MD;  Location: [place];  Service: Orthopedics;  Laterality: Right;  . REPAIR EXTENSOR TENDON Right 12/17/2014   Procedure: REPAIR EXTENSOR TENDON;  Surgeon: Leanora Cover, MD;  Location: [place];  Service: Orthopedics;  Laterality: Right;  . TUBAL LIGATION    . WISDOM TOOTH EXTRACTION     Family History:  Family History  Problem Relation Age of Onset  . Hypertension Father    Family Psychiatric  History: Denies  Social History:  History  Alcohol Use  . 0.0 oz/week     History  Drug Use  . Types: Marijuana,  Benzodiazepines    Comment: Reported prescribed Ativan    Social History   Social History  . Marital status: Legally Separated    Spouse name: N/A  . Number of children: N/A  . Years of education: N/A   Social History Main Topics  . Smoking status: Former Smoker    Packs/day: 0.00    Years: 0.00    Types: Cigarettes    Quit date: 02/12/2006  . Smokeless tobacco: Never Used  . Alcohol use 0.0 oz/week  . Drug use: Yes    Types: Marijuana, Benzodiazepines     Comment: Reported prescribed Ativan  . Sexual activity: Yes    Birth control/ protection: None   Other Topics Concern  . Not on file   Social  History Narrative  . No narrative on file   Additional Social History:    Allergies:   Allergies  Allergen Reactions  . Prednisone Nausea And Vomiting  . Hydrocodone-Acetaminophen Other (See Comments)  . Latex Rash    Labs:  Results for orders placed or performed during the hospital encounter of 07/09/16 (from the past 48 hour(s))  Comprehensive metabolic panel     Status: Abnormal   Collection Time: 07/09/16  3:29 AM  Result Value Ref Range   Sodium 143 135 - 145 mmol/L   Potassium 3.0 (L) 3.5 - 5.1 mmol/L   Chloride 108 101 - 111 mmol/L   CO2 21 (L) 22 - 32 mmol/L   Glucose, Bld 105 (H) 65 - 99 mg/dL   BUN 9 6 - 20 mg/dL   Creatinine, Ser 0.71 0.44 - 1.00 mg/dL   Calcium 8.9 8.9 - 10.3 mg/dL   Total Protein 7.6 6.5 - 8.1 g/dL   Albumin 4.1 3.5 - 5.0 g/dL   AST 37 15 - 41 U/L   ALT 39 14 - 54 U/L   Alkaline Phosphatase 65 38 - 126 U/L   Total Bilirubin 0.2 (L) 0.3 - 1.2 mg/dL   GFR calc non Af Amer >60 >60 mL/min   GFR calc Af Amer >60 >60 mL/min    Comment: (NOTE) The eGFR has been calculated using the CKD EPI equation. This calculation has not been validated in all clinical situations. eGFR's persistently <60 mL/min signify possible Chronic Kidney Disease.    Anion gap 14 5 - 15  Ethanol     Status: Abnormal   Collection Time: 07/09/16  3:29 AM  Result Value Ref Range   Alcohol, Ethyl (B) 300 (H) <5 mg/dL    Comment:        LOWEST DETECTABLE LIMIT FOR SERUM ALCOHOL IS 5 mg/dL FOR MEDICAL PURPOSES ONLY   CBC with Differential     Status: None   Collection Time: 07/09/16  3:29 AM  Result Value Ref Range   WBC 5.2 4.0 - 10.5 K/uL   RBC 4.69 3.87 - 5.11 MIL/uL   Hemoglobin 14.1 12.0 - 15.0 g/dL   HCT 41.5 36.0 - 46.0 %   MCV 88.5 78.0 - 100.0 fL   MCH 30.1 26.0 - 34.0 pg   MCHC 34.0 30.0 - 36.0 g/dL   RDW 13.2 11.5 - 15.5 %   Platelets 241 150 - 400 K/uL   Neutrophils Relative % 70 %   Neutro Abs 3.6 1.7 - 7.7 K/uL   Lymphocytes Relative 24 %   Lymphs  Abs 1.3 0.7 - 4.0 K/uL   Monocytes Relative 5 %   Monocytes Absolute 0.3 0.1 - 1.0 K/uL   Eosinophils Relative 1 %  Eosinophils Absolute 0.0 0.0 - 0.7 K/uL   Basophils Relative 0 %   Basophils Absolute 0.0 0.0 - 0.1 K/uL  Acetaminophen level     Status: Abnormal   Collection Time: 07/09/16  3:29 AM  Result Value Ref Range   Acetaminophen (Tylenol), Serum <10 (L) 10 - 30 ug/mL    Comment:        THERAPEUTIC CONCENTRATIONS VARY SIGNIFICANTLY. A RANGE OF 10-30 ug/mL MAY BE AN EFFECTIVE CONCENTRATION FOR MANY PATIENTS. HOWEVER, SOME ARE BEST TREATED AT CONCENTRATIONS OUTSIDE THIS RANGE. ACETAMINOPHEN CONCENTRATIONS >150 ug/mL AT 4 HOURS AFTER INGESTION AND >50 ug/mL AT 12 HOURS AFTER INGESTION ARE OFTEN ASSOCIATED WITH TOXIC REACTIONS.   Salicylate level     Status: None   Collection Time: 07/09/16  3:29 AM  Result Value Ref Range   Salicylate Lvl <6.7 2.8 - 30.0 mg/dL  Urine rapid drug screen (hosp performed)     Status: Abnormal   Collection Time: 07/09/16  3:43 AM  Result Value Ref Range   Opiates NONE DETECTED NONE DETECTED   Cocaine NONE DETECTED NONE DETECTED   Benzodiazepines POSITIVE (A) NONE DETECTED   Amphetamines NONE DETECTED NONE DETECTED   Tetrahydrocannabinol POSITIVE (A) NONE DETECTED   Barbiturates NONE DETECTED NONE DETECTED    Comment:        DRUG SCREEN FOR MEDICAL PURPOSES ONLY.  IF CONFIRMATION IS NEEDED FOR ANY PURPOSE, NOTIFY LAB WITHIN 5 DAYS.        LOWEST DETECTABLE LIMITS FOR URINE DRUG SCREEN Drug Class       Cutoff (ng/mL) Amphetamine      1000 Barbiturate      200 Benzodiazepine   672 Tricyclics       094 Opiates          300 Cocaine          300 THC              50   Pregnancy, urine     Status: None   Collection Time: 07/09/16  3:43 AM  Result Value Ref Range   Preg Test, Ur NEGATIVE NEGATIVE    Current Facility-Administered Medications  Medication Dose Route Frequency Provider Last Rate Last Dose  . acetaminophen (TYLENOL)  tablet 650 mg  650 mg Oral B0J PRN Delora Fuel, MD      . alum & mag hydroxide-simeth (MAALOX/MYLANTA) 200-200-20 MG/5ML suspension 30 mL  30 mL Oral G2E PRN Delora Fuel, MD      . ibuprofen (ADVIL,MOTRIN) tablet 600 mg  600 mg Oral Z6O PRN Delora Fuel, MD      . LORazepam (ATIVAN) injection 0-4 mg  0-4 mg Intravenous Q6H Francine Graven, DO       Or  . LORazepam (ATIVAN) tablet 0-4 mg  0-4 mg Oral Q6H Francine Graven, DO   1 mg at 07/09/16 1936  . [START ON 07/12/2016] LORazepam (ATIVAN) injection 0-4 mg  0-4 mg Intravenous Q12H Francine Graven, DO       Or  . Derrill Memo ON 07/12/2016] LORazepam (ATIVAN) tablet 0-4 mg  0-4 mg Oral Q12H Francine Graven, DO      . ondansetron [facility]) tablet 4 mg  4 mg Oral Q9U PRN Delora Fuel, MD      . thiamine (VITAMIN B-1) tablet 100 mg  100 mg Oral Daily Francine Graven, DO   100 mg at 07/09/16 7654   Or  . thiamine (B-1) injection 100 mg  100 mg Intravenous Daily Francine Graven, DO      .  zolpidem (AMBIEN) tablet 5 mg  5 mg Oral QHS PRN Delora Fuel, MD       Current Outpatient Prescriptions  Medication Sig Dispense Refill  . albuterol (PROVENTIL HFA;VENTOLIN HFA) 108 (90 Base) MCG/ACT inhaler Inhale 1-2 puffs into the lungs every 6 (six) hours as needed for wheezing or shortness of breath.    . amphetamine-dextroamphetamine (ADDERALL) 15 MG tablet Take 15 mg by mouth 2 (two) times daily.     [name] FLUoxetine (PROZAC) 20 MG capsule Take 1 capsule (20 mg total) by mouth daily. For depression 30 capsule 0  . LORazepam (ATIVAN) 0.5 MG tablet Take 1 tablet (0.5 mg total) by mouth at bedtime as needed for anxiety. 1 tablet 0  . zolpidem (AMBIEN) 5 MG tablet Take 5 mg by mouth at bedtime.       Musculoskeletal:  Unable to assess via camera   Psychiatric Specialty Exam: Physical Exam  Review of Systems  Psychiatric/Behavioral: Positive for depression and substance abuse. Negative for hallucinations, memory loss and suicidal ideas. The patient is  not nervous/anxious and does not have insomnia.     Blood pressure 118/66, pulse 92, resp. rate 13, height _0  (1.6 m), weight 68 kg (150 lb), SpO2 94 %.Body mass index is 26.57 kg/m.  General Appearance: Casual  Eye Contact:  Good  Speech:  Clear and Coherent  Volume:  Normal  Mood:  Depressed  Affect:  Congruent  Thought Process:  Coherent and Goal Directed  Orientation:  Full (Time, Place, and Person)  Thought Content:  Depressive symptoms   Suicidal Thoughts:  No  Homicidal Thoughts:  No  Memory:  Immediate;   Good Recent;   Good Remote;   Good  Judgement:  Fair  Insight:  Shallow  Psychomotor Activity:  Normal  Concentration:  Concentration: Good and Attention Span: Good  Recall:  Good  Fund of Knowledge:  Good  Language:  Good  Akathisia:  No  Handed:  Right  AIMS (if indicated):     Assets:  Communication Skills Desire for Improvement Financial Resources/Insurance Housing Intimacy Leisure Time Physical Health Resilience Social Support Talents/Skills  ADL's:  Intact  Cognition:  WNL  Sleep:        Treatment Plan Summary:  Patient stable to resume her outpatient treatments including recent referral from outpatient provider for psychotherapy at [facility]. Discussed case with Dr. Dwyane Dee who agrees that at this time the patient is not determined to be a danger to self or others.    Disposition: No evidence of imminent risk to self or others at present.   Patient does not meet criteria for psychiatric inpatient admission. Supportive therapy provided about ongoing stressors. Discussed crisis plan, support from social network, calling 911, coming to the Emergency Department, and calling Suicide Hotline.  Elmarie Shiley, NP 07/10/2016 10:47 AM

## 2016-07-10 NOTE — ED Notes (Signed)
[facility] reassessing pt at this time 

## 2017-01-12 ENCOUNTER — Encounter (HOSPITAL_COMMUNITY): Payer: Self-pay | Admitting: *Deleted

## 2017-01-12 ENCOUNTER — Emergency Department (HOSPITAL_COMMUNITY)
Admission: EM | Admit: 2017-01-12 | Discharge: 2017-01-13 | Disposition: A | Discharge status: Other Acute Inpatient Hospital | Payer: Medicaid Other | Attending: Emergency Medicine | Admitting: Emergency Medicine

## 2017-01-12 ENCOUNTER — Other Ambulatory Visit: Payer: Self-pay

## 2017-01-12 DIAGNOSIS — R45851 Suicidal ideations: Secondary | ICD-10-CM | POA: Diagnosis not present

## 2017-01-12 DIAGNOSIS — Y9389 Activity, other specified: Secondary | ICD-10-CM | POA: Insufficient documentation

## 2017-01-12 DIAGNOSIS — Z87891 Personal history of nicotine dependence: Secondary | ICD-10-CM | POA: Diagnosis not present

## 2017-01-12 DIAGNOSIS — Y929 Unspecified place or not applicable: Secondary | ICD-10-CM | POA: Insufficient documentation

## 2017-01-12 DIAGNOSIS — Z91040 Latex allergy status: Secondary | ICD-10-CM | POA: Insufficient documentation

## 2017-01-12 DIAGNOSIS — Z79899 Other long term (current) drug therapy: Secondary | ICD-10-CM | POA: Diagnosis not present

## 2017-01-12 DIAGNOSIS — J45909 Unspecified asthma, uncomplicated: Secondary | ICD-10-CM | POA: Insufficient documentation

## 2017-01-12 DIAGNOSIS — Y999 Unspecified external cause status: Secondary | ICD-10-CM | POA: Insufficient documentation

## 2017-01-12 DIAGNOSIS — S51812A Laceration without foreign body of left forearm, initial encounter: Secondary | ICD-10-CM | POA: Insufficient documentation

## 2017-01-12 DIAGNOSIS — F10920 Alcohol use, unspecified with intoxication, uncomplicated: Secondary | ICD-10-CM | POA: Diagnosis not present

## 2017-01-12 DIAGNOSIS — S61512A Laceration without foreign body of left wrist, initial encounter: Secondary | ICD-10-CM | POA: Diagnosis not present

## 2017-01-12 DIAGNOSIS — Z046 Encounter for general psychiatric examination, requested by authority: Secondary | ICD-10-CM | POA: Diagnosis not present

## 2017-01-12 DIAGNOSIS — X788XXA Intentional self-harm by other sharp object, initial encounter: Secondary | ICD-10-CM | POA: Diagnosis not present

## 2017-01-12 DIAGNOSIS — S59912A Unspecified injury of left forearm, initial encounter: Secondary | ICD-10-CM | POA: Diagnosis present

## 2017-01-12 DIAGNOSIS — X789XXA Intentional self-harm by unspecified sharp object, initial encounter: Secondary | ICD-10-CM

## 2017-01-12 DIAGNOSIS — S61519A Laceration without foreign body of unspecified wrist, initial encounter: Secondary | ICD-10-CM

## 2017-01-12 NOTE — ED Triage Notes (Addendum)
Pt presents to er with self inflicted laceration to left arm area, pt states that she cuts herself to relieve stress. When asked if pt had any suicidal thoughts, pt states "yea why not"

## 2017-01-12 NOTE — ED Notes (Signed)
Patient says that she cut herself with a razor blade tonight (left forearm laceration with controlled bleeding). Pt states she does see a counselor and has been taking her medications as prescribed.

## 2017-01-12 NOTE — ED Notes (Signed)
Patient removed arm band and dressing from her cut arm

## 2017-01-13 ENCOUNTER — Other Ambulatory Visit: Payer: Self-pay

## 2017-01-13 ENCOUNTER — Encounter (HOSPITAL_COMMUNITY): Payer: Self-pay

## 2017-01-13 ENCOUNTER — Inpatient Hospital Stay (HOSPITAL_COMMUNITY)
Admission: AD | Admit: 2017-01-13 | Discharge: 2017-01-15 | DRG: 885 | Disposition: A | Discharge status: Home / Self Care | Payer: Medicaid Other | Source: Intra-hospital | Attending: Psychiatry | Admitting: Psychiatry

## 2017-01-13 DIAGNOSIS — F419 Anxiety disorder, unspecified: Secondary | ICD-10-CM | POA: Diagnosis present

## 2017-01-13 DIAGNOSIS — Z91040 Latex allergy status: Secondary | ICD-10-CM

## 2017-01-13 DIAGNOSIS — S51812A Laceration without foreign body of left forearm, initial encounter: Secondary | ICD-10-CM | POA: Diagnosis not present

## 2017-01-13 DIAGNOSIS — F332 Major depressive disorder, recurrent severe without psychotic features: Principal | ICD-10-CM | POA: Diagnosis present

## 2017-01-13 DIAGNOSIS — Z87891 Personal history of nicotine dependence: Secondary | ICD-10-CM

## 2017-01-13 DIAGNOSIS — Z79899 Other long term (current) drug therapy: Secondary | ICD-10-CM | POA: Diagnosis not present

## 2017-01-13 DIAGNOSIS — Z818 Family history of other mental and behavioral disorders: Secondary | ICD-10-CM | POA: Diagnosis not present

## 2017-01-13 DIAGNOSIS — Z888 Allergy status to other drugs, medicaments and biological substances status: Secondary | ICD-10-CM

## 2017-01-13 DIAGNOSIS — Z915 Personal history of self-harm: Secondary | ICD-10-CM

## 2017-01-13 DIAGNOSIS — Z91410 Personal history of adult physical and sexual abuse: Secondary | ICD-10-CM | POA: Diagnosis not present

## 2017-01-13 DIAGNOSIS — K219 Gastro-esophageal reflux disease without esophagitis: Secondary | ICD-10-CM | POA: Diagnosis present

## 2017-01-13 DIAGNOSIS — Z630 Problems in relationship with spouse or partner: Secondary | ICD-10-CM | POA: Diagnosis not present

## 2017-01-13 DIAGNOSIS — G4700 Insomnia, unspecified: Secondary | ICD-10-CM | POA: Diagnosis present

## 2017-01-13 DIAGNOSIS — F4010 Social phobia, unspecified: Secondary | ICD-10-CM | POA: Diagnosis not present

## 2017-01-13 DIAGNOSIS — Z885 Allergy status to narcotic agent status: Secondary | ICD-10-CM | POA: Diagnosis not present

## 2017-01-13 DIAGNOSIS — Z62810 Personal history of physical and sexual abuse in childhood: Secondary | ICD-10-CM | POA: Diagnosis present

## 2017-01-13 DIAGNOSIS — F17210 Nicotine dependence, cigarettes, uncomplicated: Secondary | ICD-10-CM | POA: Diagnosis not present

## 2017-01-13 DIAGNOSIS — F4310 Post-traumatic stress disorder, unspecified: Secondary | ICD-10-CM | POA: Diagnosis present

## 2017-01-13 DIAGNOSIS — F410 Panic disorder [episodic paroxysmal anxiety] without agoraphobia: Secondary | ICD-10-CM

## 2017-01-13 DIAGNOSIS — R4587 Impulsiveness: Secondary | ICD-10-CM | POA: Diagnosis not present

## 2017-01-13 LAB — COMPREHENSIVE METABOLIC PANEL
ALT: 33 U/L (ref 14–54)
AST: 30 U/L (ref 15–41)
Albumin: 4.5 g/dL (ref 3.5–5.0)
Alkaline Phosphatase: 48 U/L (ref 38–126)
Anion gap: 6 (ref 5–15)
BUN: 6 mg/dL (ref 6–20)
CO2: 22 mmol/L (ref 22–32)
Calcium: 9 mg/dL (ref 8.9–10.3)
Chloride: 110 mmol/L (ref 101–111)
Creatinine, Ser: 0.63 mg/dL (ref 0.44–1.00)
GFR calc Af Amer: >60 mL/min (ref >60)
GFR calc non Af Amer: >60 mL/min (ref >60)
Glucose, Bld: 93 mg/dL (ref 65–99)
Potassium: 3.9 mmol/L (ref 3.5–5.1)
Sodium: 138 mmol/L (ref 135–145)
Total Bilirubin: 0.5 mg/dL (ref 0.3–1.2)
Total Protein: 7.8 g/dL (ref 6.5–8.1)

## 2017-01-13 LAB — URINALYSIS, ROUTINE W REFLEX MICROSCOPIC
Bilirubin Urine: NEGATIVE
Glucose, UA: NEGATIVE mg/dL
Hgb urine dipstick: NEGATIVE
Ketones, ur: NEGATIVE mg/dL
Leukocytes, UA: NEGATIVE
Nitrite: NEGATIVE
Protein, ur: NEGATIVE mg/dL
Specific Gravity, Urine: 1.004 — ABNORMAL LOW (ref 1.005–1.030)
pH: 5 (ref 5.0–8.0)

## 2017-01-13 LAB — CBC WITH DIFFERENTIAL/PLATELET
Basophils Absolute: 0 10*3/uL (ref 0.0–0.1)
Basophils Relative: 0 %
Eosinophils Absolute: 0.1 10*3/uL (ref 0.0–0.7)
Eosinophils Relative: 1 %
HCT: 42.4 % (ref 36.0–46.0)
Hemoglobin: 13.9 g/dL (ref 12.0–15.0)
Lymphocytes Relative: 29 %
Lymphs Abs: 1.4 10*3/uL (ref 0.7–4.0)
MCH: 29.5 pg (ref 26.0–34.0)
MCHC: 32.8 g/dL (ref 30.0–36.0)
MCV: 90 fL (ref 78.0–100.0)
Monocytes Absolute: 0.5 10*3/uL (ref 0.1–1.0)
Monocytes Relative: 10 %
Neutro Abs: 2.9 10*3/uL (ref 1.7–7.7)
Neutrophils Relative %: 60 %
Platelets: 224 10*3/uL (ref 150–400)
RBC: 4.71 MIL/uL (ref 3.87–5.11)
RDW: 13.4 % (ref 11.5–15.5)
WBC: 4.9 10*3/uL (ref 4.0–10.5)

## 2017-01-13 LAB — RAPID URINE DRUG SCREEN, HOSP PERFORMED
Amphetamines: POSITIVE — AB
Barbiturates: NOT DETECTED
Benzodiazepines: NOT DETECTED
Cocaine: NOT DETECTED
Opiates: NOT DETECTED
Tetrahydrocannabinol: NOT DETECTED

## 2017-01-13 LAB — SALICYLATE LEVEL: Salicylate Lvl: <7 mg/dL (ref 2.8–30.0)

## 2017-01-13 LAB — PREGNANCY, URINE: Preg Test, Ur: NEGATIVE

## 2017-01-13 LAB — ACETAMINOPHEN LEVEL: Acetaminophen (Tylenol), Serum: <10 ug/mL — ABNORMAL LOW (ref 10–30)

## 2017-01-13 LAB — ETHANOL: Alcohol, Ethyl (B): 182 mg/dL — ABNORMAL HIGH (ref <10)

## 2017-01-13 MED ORDER — TRAZODONE HCL 50 MG PO TABS
50.0000 mg | ORAL_TABLET | Freq: Every evening | ORAL | Status: DC | PRN
  Administered 2017-01-13 – 2017-01-14 (×2): 50 mg via ORAL
  Filled 2017-01-13 (×2): qty 1

## 2017-01-13 MED ORDER — LOPERAMIDE HCL 2 MG PO CAPS: 2.0000 mg | ORAL_CAPSULE | ORAL | Status: DC | PRN

## 2017-01-13 MED ORDER — POVIDONE-IODINE 10 % EX SOLN
CUTANEOUS | Status: AC
  Filled 2017-01-13: qty 15

## 2017-01-13 MED ORDER — ONDANSETRON 4 MG PO TBDP: 4.0000 mg | ORAL_TABLET | Freq: Four times a day (QID) | ORAL | Status: DC | PRN

## 2017-01-13 MED ORDER — VITAMIN B-1 100 MG PO TABS
100.0000 mg | ORAL_TABLET | Freq: Every day | ORAL | Status: DC
  Administered 2017-01-14 – 2017-01-15 (×2): 100 mg via ORAL
  Filled 2017-01-13 (×3): qty 1

## 2017-01-13 MED ORDER — ACETAMINOPHEN 325 MG PO TABS
650.0000 mg | ORAL_TABLET | Freq: Once | ORAL | Status: AC
  Administered 2017-01-13: 650 mg via ORAL
  Filled 2017-01-13 (×2): qty 2

## 2017-01-13 MED ORDER — TETANUS-DIPHTH-ACELL PERTUSSIS 5-2.5-18.5 LF-MCG/0.5 IM SUSP
0.5000 mL | Freq: Once | INTRAMUSCULAR | Status: DC
  Filled 2017-01-13: qty 0.5

## 2017-01-13 MED ORDER — LORAZEPAM 1 MG PO TABS: 1.0000 mg | ORAL_TABLET | Freq: Four times a day (QID) | ORAL | Status: DC | PRN

## 2017-01-13 MED ORDER — LAMOTRIGINE 25 MG PO TABS
50.0000 mg | ORAL_TABLET | Freq: Every day | ORAL | Status: DC
  Administered 2017-01-13 – 2017-01-14 (×2): 50 mg via ORAL
  Filled 2017-01-13 (×5): qty 2

## 2017-01-13 MED ORDER — LIDOCAINE-EPINEPHRINE (PF) 1 %-1:200000 IJ SOLN
20.0000 mL | Freq: Once | INTRAMUSCULAR | Status: AC
  Administered 2017-01-13: 20 mL
  Filled 2017-01-13: qty 30

## 2017-01-13 MED ORDER — PANTOPRAZOLE SODIUM 20 MG PO TBEC
20.0000 mg | DELAYED_RELEASE_TABLET | Freq: Every day | ORAL | Status: DC
  Administered 2017-01-13: 20 mg via ORAL
  Filled 2017-01-13 (×4): qty 1

## 2017-01-13 MED ORDER — HYDROXYZINE HCL 25 MG PO TABS
25.0000 mg | ORAL_TABLET | Freq: Four times a day (QID) | ORAL | Status: DC | PRN
  Administered 2017-01-13 – 2017-01-14 (×2): 25 mg via ORAL
  Filled 2017-01-13 (×2): qty 1

## 2017-01-13 MED ORDER — ADULT MULTIVITAMIN W/MINERALS CH
1.0000 | ORAL_TABLET | Freq: Every day | ORAL | Status: DC
  Administered 2017-01-13 – 2017-01-15 (×3): 1 via ORAL
  Filled 2017-01-13 (×4): qty 1

## 2017-01-13 MED ORDER — CHLORDIAZEPOXIDE HCL 25 MG PO CAPS: 25.0000 mg | ORAL_CAPSULE | Freq: Four times a day (QID) | ORAL | Status: DC | PRN

## 2017-01-13 MED ORDER — MAGNESIUM HYDROXIDE 400 MG/5ML PO SUSP: 30.0000 mL | Freq: Every day | ORAL | Status: DC | PRN

## 2017-01-13 MED ORDER — ALUM & MAG HYDROXIDE-SIMETH 200-200-20 MG/5ML PO SUSP: 30.0000 mL | ORAL | Status: DC | PRN

## 2017-01-13 MED ORDER — HYDROXYZINE HCL 25 MG PO TABS: 25.0000 mg | ORAL_TABLET | Freq: Four times a day (QID) | ORAL | Status: DC | PRN

## 2017-01-13 MED ORDER — FLUOXETINE HCL 20 MG PO CAPS
20.0000 mg | ORAL_CAPSULE | Freq: Every day | ORAL | Status: DC
  Administered 2017-01-13: 20 mg via ORAL
  Filled 2017-01-13: qty 1

## 2017-01-13 MED ORDER — FLUOXETINE HCL 20 MG PO CAPS
20.0000 mg | ORAL_CAPSULE | Freq: Every day | ORAL | Status: DC
Start: 2017-01-13 — End: 2017-01-15
  Administered 2017-01-14 – 2017-01-15 (×2): 20 mg via ORAL
  Filled 2017-01-13 (×5): qty 1

## 2017-01-13 NOTE — [facility] Group Notes (Signed)
LCSW Group Therapy Note  01/13/2017     1:15-2:15PM  Type of Therapy and Topic:  Group Therapy:  Self Sabotage  Participation Level:  Active        . Description of Group:  Today's process group focused on the topic of Self Sabotage, what this is, and what methods of self-sabotage patients in the group have found themselves using.  Commonalities were then pointed out and the group explored possible benefits of choosing healthier coping skills.  Patients were asked to rate both their commitment to change and their confidence in their ability to change from 1 (lowest) to 10 (highest), then asked about their answers in order to provoke change talk.   Therapeutic Goals 1. Patient will be able to identify their typical methods of self sabotage. 2. Patient will list reasons they engage in these destructive behaviors, and harm that comes from them 3. Patient will be able verbalize the costs and benefits of drinking/drugging versus making the choice to change 4. Patient will rate their commitment to change and confidence about their ability to change, and will be guided to change talk.  Summary of Patient Progress: During group, patient expressed her typical manner of self-sabotage is cutting herself and drinking alcohol a little even though she used to have a bad problem with it because (1) self-harm is the only way she has found to effectively calm herself down when she is overwhelmed, and (2) she feels she can handle a little alcohol.  Her commitment to change was rated at a 2 for alcohol and 9 for cutting because she feels the alcohol is not currently a problem but she could die from the cutitng and confidence in her ability to change was rated lower because she still feels that cutting herself is better than harming someone else.  Therapeutic Modalities Stages of Change Motivational Interviewing  Laura MantleMareida Grossman-Orr, LCSW 01/13/2017, 4:04 PM

## 2017-01-13 NOTE — ED Provider Notes (Signed)
I was asked to suture left forearm laceration. This was my only involvement with this patient.  LACERATION REPAIR Performed by: Burgess AmorIDOL, Makynna Manocchio Authorized by: Burgess AmorIDOL, Kyrell Ruacho Consent: Verbal consent obtained. Risks and benefits: risks, benefits and alternatives were discussed Consent given by: patient Patient identity confirmed: provided demographic data Prepped and Draped in normal sterile fashion Wound explored  Laceration Location: left forearm  Laceration Length: 10 cm  No Foreign Bodies seen or palpated  Anesthesia: local infiltration  Local anesthetic: lidocaine 1% with epinephrine  Anesthetic total: 5 ml  Irrigation method: syringe Amount of cleaning: standard  Skin closure: ethilon 4-0 and sterile strips  Number of sutures: #8 sutures, #4 wide sterile strips  Technique: simple interrupted.  Patient tolerance: Patient tolerated the procedure well with no immediate complications.    Burgess Amordol, Gigi [place], PA-C 01/13/17 0133    Glynn Octaveancour, Stephen, MD 01/13/17 [id number]

## 2017-01-13 NOTE — ED Provider Notes (Signed)
[facility] Provider Note   CSN: [id number] Arrival date & time: 01/12/17  2308     History   Chief Complaint Chief Complaint  Patient presents with  . V70.1    HPI [name] is a 31 y.o. female.  Patient presents with police after intentionally cutting herself with a razor blade tonight to her left forearm.  She states she was trying to hurt her self.  She admits to drinking 3 glasses of wine tonight.  She has multiple areas of old lacerations to her arm as well.  There is no weakness, numbness or tingling.  Bleeding is controlled.  Patient with history of PTSD and depression.  She states compliance with her medications.  She denies any hallucinations or homicidal thoughts.   The history is provided by the patient and the police.    Past Medical History:  Diagnosis Date  . Anxiety   . Asthma    no current med.  . Depression   . Hand laceration involving tendon 12/13/2014   right  . Headache   . Open metacarpal fracture 12/13/2014   right  . PTSD (post-traumatic stress disorder)     Patient Active Problem List   Diagnosis Date Noted  . Alcohol intoxication, uncomplicated (HCC)   . Polysubstance dependence, non-opioid, continuous (HCC) 06/27/2016  . Suicidal ideations 06/27/2016  . Major depressive disorder, single episode, severe, without mention of psychotic behavior   . PTSD (post-traumatic stress disorder) 12/30/2014  . 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 Alcohol use disorder, moderate, dependence (HCC) 12/29/2014  . UTI (lower urinary tract infection) 07/21/2014  . Hypokalemia 07/21/2014  . Hyperglycemia 07/21/2014  . Pyelonephritis 07/21/2014    Past Surgical History:  Procedure Laterality Date  . BONE TUMOR EXCISION Left    great toe  . CESAREAN SECTION  03/02/2013  . DILATION AND CURETTAGE OF UTERUS  01/12/2006   suction D & C  . HAND SURGERY     right  . I&D EXTREMITY Right 12/17/2014     Procedure: RIGHT HAND IRRIGATION AND DEBRIDEMENT,;  Surgeon: Betha LoaKevin Kuzma, MD;  Location: [facility];  Service: Orthopedics;  Laterality: Right;  . REPAIR EXTENSOR TENDON Right 12/17/2014   Procedure: REPAIR EXTENSOR TENDON;  Surgeon: Betha LoaKevin Kuzma, MD;  Location: [facility];  Service: Orthopedics;  Laterality: Right;  . TUBAL LIGATION    . WISDOM TOOTH EXTRACTION      OB History    Gravida Para Term Preterm AB Living   4 2 2   1 3    SAB TAB Ectopic Multiple Live Births   1       2       Home Medications    Prior to Admission medications   Medication Sig Start Date End Date Taking? Authorizing Provider  albuterol (PROVENTIL HFA;VENTOLIN HFA) 108 (90 Base) MCG/ACT inhaler Inhale 1-2 puffs into the lungs every 6 (six) hours as needed for wheezing or shortness of breath. 06/30/16 06/30/17  Armandina StammerNwoko, Agnes I, NP  amphetamine-dextroamphetamine (ADDERALL) 15 MG tablet Take 15 mg by mouth 2 (two) times daily.  07/05/16   [provider]  FLUoxetine (PROZAC) 20 MG capsule Take 1 capsule (20 mg total) by mouth daily. For depression 07/01/16   Armandina StammerNwoko, Agnes I, NP  LORazepam (ATIVAN) 0.5 MG tablet Take 1 tablet (0.5 mg total) by mouth at bedtime as needed for anxiety. 06/30/16   Armandina StammerNwoko, Agnes I, NP  zolpidem (AMBIEN) 5 MG  tablet Take 5 mg by mouth at bedtime.  07/05/16   [provider]    Family History Family History  Problem Relation Age of Onset  . Hypertension Father     Social History Social History   Tobacco Use  . Smoking status: Former Smoker    Packs/day: 0.00    Years: 0.00    Pack years: 0.00    Types: Cigarettes    Last attempt to quit: 02/12/2006    Years since quitting: 10.9  . Smokeless tobacco: Never Used  Substance Use Topics  . Alcohol use: Yes    Alcohol/week: 0.0 oz  . Drug use: Yes    Types: Marijuana, Benzodiazepines    Comment: Reported prescribed Ativan     Allergies   Prednisone; Hydrocodone-acetaminophen; and  Latex   Review of Systems Review of Systems  Constitutional: Negative for activity change, appetite change and fever.  HENT: Negative for congestion, sneezing and sore throat.   Respiratory: Negative for cough, chest tightness and shortness of breath.   Cardiovascular: Negative for chest pain.  Gastrointestinal: Negative for abdominal pain, nausea and vomiting.  Genitourinary: Negative for dysuria, hematuria and vaginal bleeding.  Musculoskeletal: Negative for arthralgias and myalgias.  Skin: Positive for wound.  Neurological: Negative for dizziness, weakness and headaches.    all other systems are negative except as noted in the HPI and PMH.    Physical Exam Updated Vital Signs BP 132/87   Pulse (!) 105   Temp 98.2 F (36.8 C) (Oral)   Resp 20   Ht 5\' 3"  (1.6 m)   Wt 72.6 kg (160 lb)   SpO2 99%   BMI 28.34 kg/m   Physical Exam  Constitutional: She is oriented to person, place, and time. She appears well-developed and well-nourished. No distress.  intoxicated  HENT:  Head: Normocephalic and atraumatic.  Mouth/Throat: Oropharynx is clear and moist. No oropharyngeal exudate.  Eyes: Conjunctivae and EOM are normal. Pupils are equal, round, and reactive to light.  Neck: Normal range of motion. Neck supple.  No meningismus.  Cardiovascular: Normal rate, regular rhythm, normal heart sounds and intact distal pulses.  No murmur heard. Pulmonary/Chest: Effort normal and breath sounds normal. No respiratory distress.  Abdominal: Soft. There is no tenderness. There is no rebound and no guarding.  Musculoskeletal: Normal range of motion. She exhibits no edema or tenderness.  There are multiple old superficial lacerations to the left volar forearm.  The acute laceration is approximately 10 cm with some fat exposed at the distal end.  The bleeding is controlled.  The proximal part of the laceration is superficial.  Full range of motion of fingers and wrist.  Intact cardinal hand  movements, wrist flexion and extension.  Intact radial pulse  Neurological: She is alert and oriented to person, place, and time. No cranial nerve deficit. She exhibits normal muscle tone. Coordination normal.  No ataxia on finger to nose bilaterally. No pronator drift. 5/5 strength throughout. CN 2-12 intact.Equal grip strength. Sensation intact.   Skin: Skin is warm.  Psychiatric: She has a normal mood and affect. Her behavior is normal.  Nursing note and vitals reviewed.    ED Treatments / Results  Labs (all labs ordered are listed, but only abnormal results are displayed) Labs Reviewed  ETHANOL - Abnormal; Notable for the following components:      Result Value   Alcohol, Ethyl (B) 182 (*)    All other components within normal limits  ACETAMINOPHEN LEVEL - Abnormal; Notable  for the following components:   Acetaminophen (Tylenol), Serum <10 (*)    All other components within normal limits  RAPID URINE DRUG SCREEN, HOSP PERFORMED - Abnormal; Notable for the following components:   Amphetamines POSITIVE (*)    All other components within normal limits  URINALYSIS, ROUTINE W REFLEX MICROSCOPIC - Abnormal; Notable for the following components:   Color, Urine STRAW (*)    Specific Gravity, Urine 1.004 (*)    All other components within normal limits  COMPREHENSIVE METABOLIC PANEL  CBC WITH DIFFERENTIAL/PLATELET  SALICYLATE LEVEL  PREGNANCY, URINE  RAPID URINE DRUG SCREEN, HOSP PERFORMED    EKG  EKG Interpretation None       Radiology No results found.  Procedures Procedures (including critical care time)  Medications Ordered in ED Medications  Tdap (BOOSTRIX) injection 0.5 mL (not administered)  lidocaine-EPINEPHrine (XYLOCAINE-EPINEPHrine) 1 %-1:200000 (PF) injection 20 mL (not administered)     Initial Impression / Assessment and Plan / ED Course  I have reviewed the triage vital signs and the nursing notes.  Pertinent labs & imaging results that were  available during my care of the patient were reviewed by me and considered in my medical decision making (see chart for details).    Patient with self-inflicted laceration to left forearm.  She is in no distress.  She does admit to trying to harm herself.  No hallucinations or homicidal ideation.  Neurovascularly intact without any evidence of tendon or nerve damage.  Tetanus is up-to-date.  Screening labs obtained.  The remarkable for alcohol intoxication. Laceration repaired by Idol PA-C.  Patient is medically clear for psychiatric evaluation.  She meets inpatient criteria and is accepted to behavioral health [facility] after 8 AM.  Holding orders placed. Patient is cooperative and voluntary at this time.   Final Clinical Impressions(s) / ED Diagnoses   Final diagnoses:  Self-inflicted laceration of wrist, initial encounter  Suicidal ideation    ED Discharge Orders    None       Abdullah Rizzi, Jeannett Senior, MD 01/13/17 [phone]
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
## 2017-01-13 NOTE — ED Notes (Signed)
Patient resting at this time.
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
## 2017-01-13 NOTE — ED Notes (Signed)
At this time, patient requesting that her mother, wanda, be called. Mother has been called and updated of patient.
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
## 2017-01-13 NOTE — ED Notes (Signed)
[name] has been notified for transport, patient has signed voluntary consent form.

## 2017-01-13 NOTE — Progress Notes (Signed)
Pt dressing changed, no sign of infection, swelling, drainage or redness.

## 2017-01-13 NOTE — Tx Team (Signed)
Initial Treatment Plan 01/13/2017 1:17 PM Laura SarahVictoria E Rochefort ZOX:[id number]    PATIENT STRESSORS: Financial difficulties Health problems Marital or family conflict Substance abuse Traumatic event   PATIENT STRENGTHS: Capable of independent living Barrister's clerkCommunication skills Motivation for treatment/growth Supportive family/friends   PATIENT IDENTIFIED PROBLEMS: Anxiety  Depression  Substance abuse  "Medication adjustment."               DISCHARGE CRITERIA:  Ability to meet basic life and health needs Improved stabilization in mood, thinking, and/or behavior Medical problems require only outpatient monitoring Reduction of life-threatening or endangering symptoms to within safe limits Verbal commitment to aftercare and medication compliance  PRELIMINARY DISCHARGE PLAN: Attend aftercare/continuing care group Attend PHP/IOP Outpatient therapy Return to previous living arrangement  PATIENT/FAMILY INVOLVEMENT: This treatment plan has been presented to and reviewed with the patient, [name], and/or family member.  The patient and family have been given the opportunity to ask questions and make suggestions.  Bethann PunchesJane O Tyjay Galindo, RN 01/13/2017, 1:17 PM
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
## 2017-01-13 NOTE — ED Notes (Signed)
Patient has left with transport, belongings have been sent with patient and transport.

## 2017-01-13 NOTE — ED Notes (Signed)
Patient awake, alert, meal given

## 2017-01-13 NOTE — ED Notes (Signed)
Attempted to call report, nurse stating she is unable to take report at this time and that charge RN will call this RN back. Facility has been made aware  That patient is en route at this time

## 2017-01-13 NOTE — ED Notes (Signed)
PA in room to suture patient's arm.

## 2017-01-13 NOTE — ED Notes (Signed)
Notified by Ala DachFord at [facility], [facility] and can transfer after 8am in the morning

## 2017-01-13 NOTE — ED Notes (Signed)
At this time, this RN has called and attempted to give report with no success. Charge nurse stating she will be available in 10 minutes.

## 2017-01-13 NOTE — Progress Notes (Signed)
D   Pt is anxious and depressed   She interacts appropriately but minimally   She expressed concern over not being started back on her antibiotics she takes for a stomach ulcer    She had already missed 3 doses  A   Encouraged pt to talk to doctor about her medications    Verbal support given   Medications administered and effectiveness monitored   Q 15 min checks R   Pt safe at present

## 2017-01-13 NOTE — ED Notes (Signed)
Patient is asleep at this time. Equal rise and fall of chest, equal respirations.

## 2017-01-13 NOTE — ED Notes (Signed)
Patient had TTS consult. Patient calm and cooperative at this time.

## 2017-01-13 NOTE — ED Notes (Signed)
Left arm has been dressed with nonadhesive and gauze

## 2017-01-14 MED ORDER — PANTOPRAZOLE SODIUM 20 MG PO TBEC
20.0000 mg | DELAYED_RELEASE_TABLET | Freq: Two times a day (BID) | ORAL | Status: DC
  Administered 2017-01-14 – 2017-01-15 (×3): 20 mg via ORAL
  Filled 2017-01-14 (×7): qty 1

## 2017-01-14 MED ORDER — METRONIDAZOLE 500 MG PO TABS
500.0000 mg | ORAL_TABLET | Freq: Two times a day (BID) | ORAL | Status: DC
  Administered 2017-01-14 – 2017-01-15 (×3): 500 mg via ORAL
  Filled 2017-01-14 (×5): qty 1

## 2017-01-14 MED ORDER — SULFAMETHOXAZOLE-TRIMETHOPRIM 400-80 MG PO TABS
1.0000 | ORAL_TABLET | Freq: Two times a day (BID) | ORAL | Status: DC
  Filled 2017-01-14: qty 1

## 2017-01-14 MED ORDER — CLARITHROMYCIN 250 MG PO TABS
500.0000 mg | ORAL_TABLET | Freq: Two times a day (BID) | ORAL | Status: DC
  Administered 2017-01-14 – 2017-01-15 (×3): 500 mg via ORAL
  Filled 2017-01-14: qty 1
  Filled 2017-01-14: qty 2
  Filled 2017-01-14: qty 1
  Filled 2017-01-14: qty 2
  Filled 2017-01-14 (×3): qty 1

## 2017-01-14 MED ORDER — LAMOTRIGINE 100 MG PO TABS
100.0000 mg | ORAL_TABLET | Freq: Every day | ORAL | Status: DC
  Administered 2017-01-15: 100 mg via ORAL
  Filled 2017-01-14 (×2): qty 1

## 2017-01-14 NOTE — Progress Notes (Signed)
[facility] MD Progress Note  01/14/2017 12:44 PM [name]  MRN:  [id number]   Subjective:  Patient reports that she is doing well. She states she slept well and has a good appetite. She reports that she talked to her boyfriend and they have worked things out and she will be returning home to live with him. She denies any left wrist pain. She denies any medication side effects and denies any SI/HI/AVH and contracts for safety. She rates depression at 2/10 and anxiety at 2/10. She asks about continuing her increase of her Lamictal since she has been on 50 mg for 2 weeks now.  Objective: Patient's chart and findings reviewed and discussed with treatment team. Patient is pleasant and cooperative. She has been seen in the day room interacting with peers and staff. Will continue with the taper increase of Lamictal and increase to 100 mg Daily.  Principal Problem: Severe recurrent major depression without psychotic features ([place]) Diagnosis:   Patient Active Problem List   Diagnosis Date Noted  . Severe recurrent major depression without psychotic features ([place]) [F33.2] 01/13/2017  . GERD (gastroesophageal reflux disease) [K21.9] 01/13/2017  . Alcohol intoxication, uncomplicated ([place]) [Z02.585]   . Polysubstance dependence, non-opioid, continuous ([place]) [F19.20] 06/27/2016  . Suicidal ideations [R45.851] 06/27/2016  . Major depressive disorder, single episode, severe, without mention of psychotic behavior [F32.2]   . PTSD (post-traumatic stress disorder) [F43.10] 12/30/2014  . Severe major depression without psychotic features ([place]) [F32.2] 12/29/2014  . Alcohol use disorder, moderate, dependence ([place]) [F10.20] 12/29/2014  . UTI (lower urinary tract infection) [N39.0] 07/21/2014  . Hypokalemia [E87.6] 07/21/2014  . Hyperglycemia [R73.9] 07/21/2014  . Pyelonephritis [N12] 07/21/2014   Total Time spent with patient: 25 minutes  Past Psychiatric History: See H&P  Past Medical History:  Past  Medical History:  Diagnosis Date  . Anxiety   . Asthma    no current med.  . Depression   . Hand laceration involving tendon 12/13/2014   right  . Headache   . Open metacarpal fracture 12/13/2014   right  . PTSD (post-traumatic stress disorder)     Past Surgical History:  Procedure Laterality Date  . BONE TUMOR EXCISION Left    great toe  . CESAREAN SECTION  03/02/2013  . DILATION AND CURETTAGE OF UTERUS  01/12/2006   suction D & C  . HAND SURGERY     right  . I&D EXTREMITY Right 12/17/2014   Procedure: RIGHT HAND IRRIGATION AND DEBRIDEMENT,;  Surgeon: Leanora Cover, MD;  Location: [facility];  Service: Orthopedics;  Laterality: Right;  . REPAIR EXTENSOR TENDON Right 12/17/2014   Procedure: REPAIR EXTENSOR TENDON;  Surgeon: Leanora Cover, MD;  Location: [facility];  Service: Orthopedics;  Laterality: Right;  . TUBAL LIGATION    . WISDOM TOOTH EXTRACTION     Family History:  Family History  Problem Relation Age of Onset  . Hypertension Father    Family Psychiatric  History: See H&P Social History:  Social History   Substance and Sexual Activity  Alcohol Use Yes  . Alcohol/week: 0.0 oz     Social History   Substance and Sexual Activity  Drug Use Yes  . Types: Marijuana, Benzodiazepines   Comment: Reported prescribed Ativan    Social History   Socioeconomic History  . Marital status: Legally Separated    Spouse name: None  . Number of children: None  . Years of education: None  . Highest education level: None  Social  Needs  . Financial resource strain: None  . Food insecurity - worry: None  . Food insecurity - inability: None  . Transportation needs - medical: None  . Transportation needs - non-medical: None  Occupational History  . None  Tobacco Use  . Smoking status: Former Smoker    Packs/day: 0.00    Years: 0.00    Pack years: 0.00    Types: Cigarettes    Last attempt to quit: 02/12/2006    Years since quitting: 10.9  .  Smokeless tobacco: Never Used  Substance and Sexual Activity  . Alcohol use: Yes    Alcohol/week: 0.0 oz  . Drug use: Yes    Types: Marijuana, Benzodiazepines    Comment: Reported prescribed Ativan  . Sexual activity: Yes    Birth control/protection: None  Other Topics Concern  . None  Social History Narrative  . None   Additional Social History:    Pain Medications: See MAR Prescriptions: See MAR Over the Counter: See MAR History of alcohol / drug use?: Yes Longest period of sobriety (when/how long): unspecified Negative Consequences of Use: Financial, Personal relationships Name of Substance 1: Alcohol 1 - Age of First Use: Adolescent 1 - Amount (size/oz): 1/2-1 pint liquor 1 - Frequency: 1-2 times per week 1 - Duration: Ongoing 1 - Last Use / Amount: 01/12/17, 2-3 glasses of wine                  Sleep: Good  Appetite:  Good  Current Medications: Current Facility-Administered Medications  Medication Dose Route Frequency Provider Last Rate Last Dose  . alum & mag hydroxide-simeth (MAALOX/MYLANTA) 200-200-20 MG/5ML suspension 30 mL  30 mL Oral Q4H PRN Lindon Romp A, NP      . chlordiazePOXIDE (LIBRIUM) capsule 25 mg  25 mg Oral Q6H PRN Cobos, Myer Peer, MD      . clarithromycin (BIAXIN) tablet 500 mg  500 mg Oral Q12H Lindon Romp A, NP   500 mg at 01/14/17 1006  . FLUoxetine (PROZAC) capsule 20 mg  20 mg Oral Daily Lindon Romp A, NP   20 mg at 01/14/17 0753  . hydrOXYzine (ATARAX/VISTARIL) tablet 25 mg  25 mg Oral Q6H PRN Cobos, Myer Peer, MD   25 mg at 01/13/17 2124  . [START ON 01/15/2017] lamoTRIgine (LAMICTAL) tablet 100 mg  100 mg Oral Daily Anyra Kaufman, Darnelle Maffucci B, FNP      . loperamide (IMODIUM) capsule 2-4 mg  2-4 mg Oral PRN Cobos, Myer Peer, MD      . magnesium hydroxide (MILK OF MAGNESIA) suspension 30 mL  30 mL Oral Daily PRN Lindon Romp A, NP      . metroNIDAZOLE (FLAGYL) tablet 500 mg  500 mg Oral Q12H Lindon Romp A, NP   500 mg at 01/14/17 0757  .  multivitamin with minerals tablet 1 tablet  1 tablet Oral Daily Cobos, Myer Peer, MD   1 tablet at 01/14/17 0757  . ondansetron (ZOFRAN-ODT) disintegrating tablet 4 mg  4 mg Oral Q6H PRN Cobos, Myer Peer, MD      . pantoprazole (PROTONIX) EC tablet 20 mg  20 mg Oral BID AC Lindon Romp A, NP   20 mg at 01/14/17 [id number]  . thiamine (VITAMIN B-1) tablet 100 mg  100 mg Oral Daily Cobos, Myer Peer, MD   100 mg at 01/14/17 0757  . traZODone (DESYREL) tablet 50 mg  50 mg Oral QHS PRN Lindon Romp A, NP   50 mg at 01/13/17 2124  Lab Results:  Results for orders placed or performed during the hospital encounter of 01/12/17 (from the past 48 hour(s))  Comprehensive metabolic panel     Status: None   Collection Time: 01/12/17 11:36 PM  Result Value Ref Range   Sodium 138 135 - 145 mmol/L   Potassium 3.9 3.5 - 5.1 mmol/L   Chloride 110 101 - 111 mmol/L   CO2 22 22 - 32 mmol/L   Glucose, Bld 93 65 - 99 mg/dL   BUN 6 6 - 20 mg/dL   Creatinine, Ser 0.63 0.44 - 1.00 mg/dL   Calcium 9.0 8.9 - 10.3 mg/dL   Total Protein 7.8 6.5 - 8.1 g/dL   Albumin 4.5 3.5 - 5.0 g/dL   AST 30 15 - 41 U/L   ALT 33 14 - 54 U/L   Alkaline Phosphatase 48 38 - 126 U/L   Total Bilirubin 0.5 0.3 - 1.2 mg/dL   GFR calc non Af Amer >60 >60 mL/min   GFR calc Af Amer >60 >60 mL/min    Comment: (NOTE) The eGFR has been calculated using the CKD EPI equation. This calculation has not been validated in all clinical situations. eGFR's persistently <60 mL/min signify possible Chronic Kidney Disease.    Anion gap 6 5 - 15  Ethanol     Status: Abnormal   Collection Time: 01/12/17 11:36 PM  Result Value Ref Range   Alcohol, Ethyl (B) 182 (H) <10 mg/dL    Comment:        LOWEST DETECTABLE LIMIT FOR SERUM ALCOHOL IS 10 mg/dL FOR MEDICAL PURPOSES ONLY   CBC with Diff     Status: None   Collection Time: 01/12/17 11:36 PM  Result Value Ref Range   WBC 4.9 4.0 - 10.5 K/uL   RBC 4.71 3.87 - 5.11 MIL/uL   Hemoglobin 13.9 12.0  - 15.0 g/dL   HCT 42.4 36.0 - 46.0 %   MCV 90.0 78.0 - 100.0 fL   MCH 29.5 26.0 - 34.0 pg   MCHC 32.8 30.0 - 36.0 g/dL   RDW 13.4 11.5 - 15.5 %   Platelets 224 150 - 400 K/uL   Neutrophils Relative % 60 %   Neutro Abs 2.9 1.7 - 7.7 K/uL   Lymphocytes Relative 29 %   Lymphs Abs 1.4 0.7 - 4.0 K/uL   Monocytes Relative 10 %   Monocytes Absolute 0.5 0.1 - 1.0 K/uL   Eosinophils Relative 1 %   Eosinophils Absolute 0.1 0.0 - 0.7 K/uL   Basophils Relative 0 %   Basophils Absolute 0.0 0.0 - 0.1 K/uL  Acetaminophen level     Status: Abnormal   Collection Time: 01/12/17 11:36 PM  Result Value Ref Range   Acetaminophen (Tylenol), Serum <10 (L) 10 - 30 ug/mL    Comment:        THERAPEUTIC CONCENTRATIONS VARY SIGNIFICANTLY. A RANGE OF 10-30 ug/mL MAY BE AN EFFECTIVE CONCENTRATION FOR MANY PATIENTS. HOWEVER, SOME ARE BEST TREATED AT CONCENTRATIONS OUTSIDE THIS RANGE. ACETAMINOPHEN CONCENTRATIONS >150 ug/mL AT 4 HOURS AFTER INGESTION AND >50 ug/mL AT 12 HOURS AFTER INGESTION ARE OFTEN ASSOCIATED WITH TOXIC REACTIONS.   Salicylate level     Status: None   Collection Time: 01/12/17 11:36 PM  Result Value Ref Range   Salicylate Lvl <5.6 2.8 - 30.0 mg/dL  Rapid urine drug screen (hospital performed)     Status: Abnormal   Collection Time: 01/13/17 12:29 AM  Result Value Ref Range   Opiates NONE DETECTED  NONE DETECTED   Cocaine NONE DETECTED NONE DETECTED   Benzodiazepines NONE DETECTED NONE DETECTED   Amphetamines POSITIVE (A) NONE DETECTED   Tetrahydrocannabinol NONE DETECTED NONE DETECTED   Barbiturates NONE DETECTED NONE DETECTED    Comment:        DRUG SCREEN FOR MEDICAL PURPOSES ONLY.  IF CONFIRMATION IS NEEDED FOR ANY PURPOSE, NOTIFY LAB WITHIN 5 DAYS.        LOWEST DETECTABLE LIMITS FOR URINE DRUG SCREEN Drug Class       Cutoff (ng/mL) Amphetamine      1000 Barbiturate      200 Benzodiazepine   403 Tricyclics       474 Opiates          300 Cocaine          300 THC               50   Pregnancy, urine     Status: None   Collection Time: 01/13/17 12:29 AM  Result Value Ref Range   Preg Test, Ur NEGATIVE NEGATIVE    Comment:        THE SENSITIVITY OF THIS METHODOLOGY IS >20 mIU/mL.   Urinalysis, Routine w reflex microscopic     Status: Abnormal   Collection Time: 01/13/17 12:29 AM  Result Value Ref Range   Color, Urine STRAW (A) YELLOW   APPearance CLEAR CLEAR   Specific Gravity, Urine 1.004 (L) 1.005 - 1.030   pH 5.0 5.0 - 8.0   Glucose, UA NEGATIVE NEGATIVE mg/dL   Hgb urine dipstick NEGATIVE NEGATIVE   Bilirubin Urine NEGATIVE NEGATIVE   Ketones, ur NEGATIVE NEGATIVE mg/dL   Protein, ur NEGATIVE NEGATIVE mg/dL   Nitrite NEGATIVE NEGATIVE   Leukocytes, UA NEGATIVE NEGATIVE    Blood Alcohol level:  Lab Results  Component Value Date   ETH 182 (H) 01/12/2017   ETH 300 (H) 25/95/6387    Metabolic Disorder Labs: No results found for: HGBA1C, MPG No results found for: PROLACTIN No results found for: CHOL, TRIG, HDL, CHOLHDL, VLDL, LDLCALC  Physical Findings: AIMS: Facial and Oral Movements Muscles of Facial Expression: None, normal Lips and Perioral Area: None, normal Jaw: None, normal Tongue: None, normal,Extremity Movements Upper (arms, wrists, hands, fingers): None, normal Lower (legs, knees, ankles, toes): None, normal, Trunk Movements Neck, shoulders, hips: None, normal, Overall Severity Severity of abnormal movements (highest score from questions above): None, normal Incapacitation due to abnormal movements: None, normal Patient's awareness of abnormal movements (rate only patient's report): No Awareness, Dental Status Current problems with teeth and/or dentures?: Yes Does patient usually wear dentures?: No  CIWA:  CIWA-Ar Total: 2 COWS:  COWS Total Score: 3  Musculoskeletal: Strength & Muscle Tone: within normal limits Gait & Station: normal Patient leans: N/A  Psychiatric Specialty Exam: Physical Exam  Nursing note  and vitals reviewed. Constitutional: She is oriented to person, place, and time. She appears well-developed and well-nourished.  Cardiovascular: Normal rate.  Respiratory: Effort normal.  Musculoskeletal: Normal range of motion.  Neurological: She is alert and oriented to person, place, and time.  Skin: Skin is warm.    Review of Systems  Constitutional: Negative.   HENT: Negative.   Eyes: Negative.   Respiratory: Negative.   Cardiovascular: Negative.   Gastrointestinal: Negative.   Genitourinary: Negative.   Musculoskeletal: Negative.   Skin:       Laceration on left wrist  Neurological: Negative.   Endo/Heme/Allergies: Negative.   Psychiatric/Behavioral: Positive for depression. Negative for hallucinations  and suicidal ideas. The patient is nervous/anxious.     Blood pressure (!) 130/91, pulse 98, temperature 98.2 F (36.8 C), temperature source Oral, resp. rate 16, height _0  (1.6 m), weight 72.6 kg (160 lb), SpO2 100 %.Body mass index is 28.34 kg/m.  General Appearance: Casual  Eye Contact:  Good  Speech:  Clear and Coherent and Normal Rate  Volume:  Normal  Mood:  Depressed  Affect:  Congruent  Thought Process:  Goal Directed and Descriptions of Associations: Intact  Orientation:  Full (Time, Place, and Person)  Thought Content:  WDL  Suicidal Thoughts:  No  Homicidal Thoughts:  No  Memory:  Immediate;   Good Recent;   Good Remote;   Good  Judgement:  Fair  Insight:  Good  Psychomotor Activity:  Normal  Concentration:  Concentration: Good and Attention Span: Good  Recall:  Good  Fund of Knowledge:  Good  Language:  Good  Akathisia:  No  Handed:  Right  AIMS (if indicated):     Assets:  Communication Skills Desire for Improvement Financial Resources/Insurance Housing Physical Health Social Support Transportation  ADL's:  Intact  Cognition:  WNL  Sleep:  Number of Hours: 5.5   Problems Addressed: MDD Severe GERD  Treatment Plan Summary: Daily  contact with patient to assess and evaluate symptoms and progress in treatment, Medication management and Plan is to:  -Continue Prozac 20 mg PO Daily for mood stability -Increase Lamictal 100 mg PO Daily for mood stability -Continue Vistaril 25 mg Q6H PRN for anxiety -Continue Trazodone 50 mg PO QHS PRN for insomnia -Discontinue Librium Protocol -Encourage group therapy participation   Lewis Shock, FNP 01/14/2017, 12:44 PM

## 2017-01-14 NOTE — Progress Notes (Signed)
D   Pt is anxious and depressed   She interacts appropriately but minimally       Pt reports her day has been better and she feels less anxious A   Encouraged pt to talk to doctor about her medications    Verbal support given   Medications administered and effectiveness monitored   Q 15 min checks R   Pt safe at present

## 2017-01-14 NOTE — Progress Notes (Signed)
Patient did attend the evening speaker AA meeting.  

## 2017-01-14 NOTE — [facility] Group Notes (Signed)
[facility]  Date/Time:  01/14/2017 1:15-2:15pm  Type of Therapy and Topic:  Group Therapy:  Healthy and Unhealthy Supports  Participation Level:  Active   Description of Group:  Patients in this group were introduced to the idea of adding a variety of healthy supports to address the various needs in their lives. The picture on the front of Sunday's workbook was used to demonstrate why more supports are needed in every patient's life.  Patients identified and described healthy supports versus unhealthy supports in general, then gave examples of each in their own lives.   They discussed what additional healthy supports could be helpful in their recovery and wellness after discharge in order to prevent future hospitalizations.   An emphasis was placed on using counselor, doctor, therapy groups, 12-step groups, and problem-specific support groups to expand supports.  They also worked as a group on developing a specific plan for several patients to deal with unhealthy supports through boundary-setting, psychoeducation with loved ones, and even termination of relationships.   Therapeutic Goals:   1)  discuss importance of adding supports to stay well once out of the hospital  2)  compare healthy versus unhealthy supports and identify some examples of each  3)  generate ideas and descriptions of healthy supports that can be added  4)  offer mutual support about how to address unhealthy supports  5)  encourage active participation in and adherence to discharge plan    Summary of Patient Progress:  The patient shared fully.   Therapeutic Modalities:   Motivational Interviewing Brief Solution-Focused Therapy  Ambrose MantleMareida Grossman-Orr, LCSW 01/14/2017, 11:00AM

## 2017-01-15 MED ORDER — LAMOTRIGINE 100 MG PO TABS: 100.0000 mg | ORAL_TABLET | Freq: Every day | ORAL | 0 refills | Status: AC

## 2017-01-15 NOTE — BHH Suicide Risk Assessment (Signed)
[facility]   Principal Problem: Severe recurrent major depression without psychotic features [facility](HCC) Discharge Diagnoses:  Patient Active Problem List   Diagnosis Date Noted  . Severe recurrent major depression without psychotic features ([facility]) [F33.2] 01/13/2017  . GERD (gastroesophageal reflux disease) [K21.9] 01/13/2017  . Alcohol intoxication, uncomplicated ([facility]) [F10.920]   . Polysubstance dependence, non-opioid, continuous ([facility]) [F19.20] 06/27/2016  . Suicidal ideations [R45.851] 06/27/2016  . Major depressive disorder, single episode, severe, without mention of psychotic behavior [F32.2]   . PTSD (post-traumatic stress disorder) [F43.10] 12/30/2014  . 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 Alcohol use disorder, moderate, dependence ([facility]) [F10.20] 12/29/2014  . UTI (lower urinary tract infection) [N39.0] 07/21/2014  . Hypokalemia [E87.6] 07/21/2014  . Hyperglycemia [R73.9] 07/21/2014  . Pyelonephritis [N12] 07/21/2014    Total Time spent with patient: 30 minutes  Musculoskeletal: Strength & Muscle Tone: within normal limits Gait & Station: normal Patient leans: N/A  Psychiatric Specialty Exam: ROS no headache, no chest pain, no shortness of breath, no vomiting  Blood pressure 122/81, pulse 81, temperature 98.3 F (36.8 C), resp. rate 16, height 5\' 3"  (1.6 m), weight 72.6 kg (160 lb), SpO2 100 %.Body mass index is 28.34 kg/m.  General Appearance: improved grooming   Eye Contact::  Good  Speech:  Normal Rate409  Volume:  Normal  Mood:  denies feeling depressed at this time and presents euthymic   Affect:  Appropriate and Full Range  Thought Process:  Linear and Descriptions of Associations: Intact  Orientation:  Full (Time, Place, and Person)  Thought Content:  denies hallucinations, no delusions , not internally preoccupied   Suicidal Thoughts:  No denies any suicidal or self injurious ideations   Homicidal Thoughts:  No denies homicidal ideations  Memory:  recent and remote grossly intact   Judgement:  Other:  improved   Insight:  improving   Psychomotor Activity:  Normal- no tremors , no diaphoresis, no restlessness   Concentration:  Good  Recall:  Good  Fund of Knowledge:Good  Language: Good  Akathisia:  Negative  Handed:  Right  AIMS (if indicated):     Assets:  Communication Skills Desire for Improvement Resilience  Sleep:  Number of Hours: 5.75  Cognition: WNL  ADL's:  Intact   Mental Status Per Nursing Assessment::   On Admission:     Demographic Factors:  31 year old female, has three children, lives with BF, currently unemployed .  Loss Factors: Relationship stressor, alcohol intoxication, youngest child has history of congenital GI malformation  Historical Factors: History of depression, history of alcohol abuse , history of self cutting   Risk Reduction Factors:   Responsible for children under 30 years of age, Sense of responsibility to family, Living with another person, especially a relative, Positive social support and Positive coping skills or problem solving skills  Continued Clinical Symptoms:  At this time patient is alert, attentive, well related, pleasant, mood is improved and currently euthymic, affect is appropriate and fully reactive, no thought disorder, no suicidal or self injurious ideations, no homicidal or violent ideations, future oriented .  Denies medication side effects.  Self inflicted cut on L forearm is healing well, no current bleeding or drainage, bandaged.    Cognitive Features That Contribute To Risk:  No gross cognitive deficits noted upon discharge. Is alert , attentive, and oriented x 3   Suicide Risk:  Mild:  Suicidal ideation of limited frequency, intensity, duration, and specificity.  There  are no identifiable plans, no associated intent, mild dysphoria and related symptoms, good self-control (both objective and  subjective assessment), few other risk factors, and identifiable protective factors, including available and accessible social support.  Follow-up Information    [facility] Follow up on 01/15/2017.   Why:  Medication management/hospital follow-up today at 1:00PM with Lovina ReachAlicia Carter and Therapy on Wed, December 5th at 1:00PM with Thea SilversmithMackenzie. Thank you.  Contact information: [address] [place] [address] [phone]           Plan Of Care/Follow-up recommendations:  Activity:  as tolerated  Diet:  regular Tests:  NA Other:  See below   Patient is expressing readiness for discharge and is leaving unit in good spirits  Plans to return home Plans to follow up as above  We discussed importance of abstinence, sobriety as integral part of treatment , and encouraged her to consider attending AA meetings .  Craige CottaFernando A Garl Speigner, MD 01/15/2017, 10:03 AM

## 2017-01-15 NOTE — Progress Notes (Signed)
Recreation Therapy Notes  Date: 01/15/17 Time: 0930 Location: [address] Dayroom  Group Topic: Stress Management  Goal Area(s) Addresses:  Patient will verbalize importance of using healthy stress management.  Patient will identify positive emotions associated with healthy stress management.   Behavioral Response: Engaged  Intervention: Stress Management  Activity :  Meditation.  LRT introduced the stress management technique of meditation.  LRT played a body scan meditation from the Calm app that allowed patients to take inventory of the tension or sensations they were feeling.  Education:  Stress Management, Discharge Planning.   Education Outcome: Acknowledges edcuation/In group clarification offered/Needs additional education  Clinical Observations/Feedback: Pt attended group.   [name], LRT/CTRS         [name] A 01/15/2017 12:40 PM 

## 2017-01-15 NOTE — Progress Notes (Signed)
  [facility] :  Will you be returning to the same living situation after discharge:  Yes,  home At discharge, do you have transportation home?: Yes,  family member Do you have the ability to pay for your medications: Yes,  mental health  Release of information consent forms completed and submitted to medical records by CSW.  Patient to Follow up at: Follow-up Information    [facility] Follow up on 01/15/2017.   Why:  Medication management/hospital follow-up today at 1:00PM with Lovina ReachAlicia Carter and Therapy on Wed, December 5th at 1:00PM with Thea SilversmithMackenzie. Thank you.  Contact information: [address] [place] [address] [phone]           Next level of care provider [facility] Link:no  Safety Planning and Suicide Prevention discussed: Yes,  SPE completed with pt; pt declined to consent to family contact. SPI pamphlet and Mobile Crisis information provided.  Have you used any form of tobacco in the last 30 days? (Cigarettes, Smokeless Tobacco, Cigars, and/or Pipes): Yes  Has patient been referred to the Quitline?: Patient refused referral  Patient has been referred for addiction treatment: Yes  Pulte HomesHeather N Smart, LCSW 01/15/2017, 9:36 AM

## 2017-01-15 NOTE — Progress Notes (Signed)
Discharge note:  Patient discharged home per MD order.  Patient received all personal belongings from unit and locker. Reviewed AVS/transition record with patient and she indicated understanding.  Patient denies any thoughts of self harm.  Patient left with prescriptions of her medications. [facility]  She left ambulatory with [facility].  [facility] she will be [facility].

## 2017-01-15 NOTE — [facility] Suicide Risk Assessment (Signed)
[facility] INPATIENT:  Family/Significant Other Suicide Prevention Education  Suicide Prevention Education:  Patient Refusal for Family/Significant Other Suicide Prevention Education: The patient [name] has refused to provide written consent for family/significant other to be provided Family/Significant Other Suicide Prevention Education during admission and/or prior to discharge.  Physician notified.  SPE completed with pt, as pt refused to consent to family contact. SPI pamphlet provided to pt and pt was encouraged to share information with support network, ask questions, and talk about any concerns relating to SPE. Pt denies access to guns/firearms and verbalized understanding of information provided. Mobile Crisis information also provided to pt.   Espiridion Supinski N Smart LCSW 01/15/2017, 9:30 AM
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
## 2017-01-15 NOTE — [facility] Counselor (Signed)
Pt discharging within 72 hours of admission. PSA not required.  Trula SladeHeather Smart, MSW, LCSW Clinical Social Worker 01/15/2017 9:30 AM
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
## 2017-08-24 ENCOUNTER — Encounter (INDEPENDENT_AMBULATORY_CARE_PROVIDER_SITE_OTHER): Payer: Self-pay | Admitting: Internal Medicine

## 2017-11-20 ENCOUNTER — Encounter (INDEPENDENT_AMBULATORY_CARE_PROVIDER_SITE_OTHER): Payer: Self-pay | Admitting: Internal Medicine

## 2017-11-20 ENCOUNTER — Ambulatory Visit (INDEPENDENT_AMBULATORY_CARE_PROVIDER_SITE_OTHER): Payer: Medicaid Other | Admitting: Internal Medicine

## 2017-11-20 VITALS — BP 160/100 | HR 116 | Temp 97.9°F | Ht 63.0 in | Wt 201.2 lb

## 2017-11-20 DIAGNOSIS — B1920 Unspecified viral hepatitis C without hepatic coma: Secondary | ICD-10-CM | POA: Diagnosis not present

## 2017-11-20 NOTE — Progress Notes (Signed)
Subjective:    Patient ID: [name], female    DOB: May 30, 1985, 32 y.o.   MRN: [id number]  HPI Referred by Dr. Lysbeth Galas for Hepatitis C tx. This is a new diagnoses. She denies any IV drug use. She has multiple tattoos.  No risk factors for Hepatitis C. Appetite is good. No weight loss. BMs are normal  Hx of suicidal thoughts but not in the past 6 months.  Hx of etoh abuse. (Did drink about a pint a day. Slowed down 9 months ago). Drinks 6 beers a weeks. 08/01/2017 total bili less than 0.2, ALP 69, AST 18, ALT 29.  08/02/2017 Hep A IgM negative. Hepatitis B Surface Ag negative. Hep B core IgM ab negative. Hep C antibody greater than 11.  Prior hx of drug abuse.  Review of Systems Past Medical History:  Diagnosis Date  . Anxiety   . Asthma    no current med.  . Depression   . Hand laceration involving tendon 12/13/2014   right  . Headache   . Open metacarpal fracture 12/13/2014   right  . PTSD (post-traumatic stress disorder)     Past Surgical History:  Procedure Laterality Date  . BONE TUMOR EXCISION Left    great toe  . CESAREAN SECTION  03/02/2013  . DILATION AND CURETTAGE OF UTERUS  01/12/2006   suction D & C  . HAND SURGERY     right  . I&D EXTREMITY Right 12/17/2014   Procedure: RIGHT HAND IRRIGATION AND DEBRIDEMENT,;  Surgeon: Betha Loa, MD;  Location: [facility];  Service: Orthopedics;  Laterality: Right;  . REPAIR EXTENSOR TENDON Right 12/17/2014   Procedure: REPAIR EXTENSOR TENDON;  Surgeon: Betha Loa, MD;  Location: [facility];  Service: Orthopedics;  Laterality: Right;  . TUBAL LIGATION    . WISDOM TOOTH EXTRACTION      Allergies  Allergen Reactions  . Prednisone Nausea And Vomiting  . Hydrocodone-Acetaminophen Other (See Comments)  . Latex Rash    Current Outpatient Medications on File Prior to Visit  Medication Sig Dispense Refill  . amphetamine-dextroamphetamine (ADDERALL XR) 20 MG 24 hr capsule Take 20 mg  by mouth 2 (two) times daily.    . Ergocalciferol (VITAMIN D2 PO) Take by mouth. 1.25  50,000 unit once a week.    [name] FLUoxetine (PROZAC) 20 MG capsule Take 1 capsule (20 mg total) by mouth daily. For depression 30 capsule 0  . lamoTRIgine (LAMICTAL) 100 MG tablet Take 1 tablet (100 mg total) by mouth daily. For mood control 30 tablet 0  . OXcarbazepine (TRILEPTAL) 150 MG tablet Take 150 mg by mouth 2 (two) times daily.    [name] topiramate (TOPAMAX) 100 MG tablet Take 100 mg by mouth 2 (two) times daily.    [name] albuterol (PROVENTIL HFA;VENTOLIN HFA) 108 (90 Base) MCG/ACT inhaler Inhale 1-2 puffs into the lungs every 6 (six) hours as needed for wheezing or shortness of breath.     No current facility-administered medications on file prior to visit.         Objective:   Physical Exam Blood pressure (!) 160/100, pulse (!) 116, temperature 97.9 F (36.6 C), height 5\' 3"  (1.6 m), weight 201 lb 3.2 oz (91.3 kg). Alert and oriented. Skin warm and dry. Oral mucosa is moist.   . Sclera anicteric, conjunctivae is pink. Thyroid not enlarged. No cervical lymphadenopathy. Lungs clear. Heart regular rate and rhythm.  Abdomen is soft. Bowel sounds are positive. No hepatomegaly. No  abdominal masses felt. No tenderness.  No edema to lower extremities.   (Self inflicted old healed scar to her rt wrist).          Assessment & Plan:  Hepatitis C. Will get a CBC, Hepatic, PT/INR, AFP, Hep C antibody, Hep C quaint, Hep C genotype, Urine drug screen, HIV US abdomen elastrography.  Once labs and elastrograhy back will decide which drug to treat her with.

## 2017-11-20 NOTE — Patient Instructions (Addendum)
Labs. And US abdomen.

## 2017-11-23 LAB — HEPATIC FUNCTION PANEL
AG Ratio: 1.5 (calc) (ref 1.0–2.5)
ALT: 25 U/L (ref 6–29)
AST: 19 U/L (ref 10–30)
Albumin: 4.2 g/dL (ref 3.6–5.1)
Alkaline phosphatase (APISO): 63 U/L (ref 33–115)
Bilirubin, Direct: 0.1 mg/dL (ref 0.0–0.2)
Globulin: 2.8 g/dL (calc) (ref 1.9–3.7)
Indirect Bilirubin: 0.1 mg/dL (calc) — ABNORMAL LOW (ref 0.2–1.2)
Total Bilirubin: 0.2 mg/dL (ref 0.2–1.2)
Total Protein: 7 g/dL (ref 6.1–8.1)

## 2017-11-23 LAB — CBC WITH DIFFERENTIAL/PLATELET
Basophils Absolute: 50 cells/uL (ref 0–200)
Basophils Relative: 0.8 %
Eosinophils Absolute: 347 cells/uL (ref 15–500)
Eosinophils Relative: 5.5 %
HCT: 38.3 % (ref 35.0–45.0)
Hemoglobin: 12.7 g/dL (ref 11.7–15.5)
Lymphs Abs: 1562 cells/uL (ref 850–3900)
MCH: 28.5 pg (ref 27.0–33.0)
MCHC: 33.2 g/dL (ref 32.0–36.0)
MCV: 86.1 fL (ref 80.0–100.0)
MPV: 10.7 fL (ref 7.5–12.5)
Monocytes Relative: 6.9 %
Neutro Abs: 3906 cells/uL (ref 1500–7800)
Neutrophils Relative %: 62 %
Platelets: 268 10*3/uL (ref 140–400)
RBC: 4.45 10*6/uL (ref 3.80–5.10)
RDW: 13.1 % (ref 11.0–15.0)
Total Lymphocyte: 24.8 %
WBC mixed population: 435 cells/uL (ref 200–950)
WBC: 6.3 10*3/uL (ref 3.8–10.8)

## 2017-11-23 LAB — DRUGS OF ABUSE SCREEN W/O ALC, ROUTINE URINE
AMPHETAMINES (1000 ng/mL SCRN): NEGATIVE
BARBITURATES: NEGATIVE
BENZODIAZEPINES: NEGATIVE
COCAINE METABOLITES: NEGATIVE
MARIJUANA MET (50 ng/mL SCRN): NEGATIVE
METHADONE: NEGATIVE
METHAQUALONE: NEGATIVE
OPIATES: NEGATIVE
PHENCYCLIDINE: NEGATIVE
PROPOXYPHENE: NEGATIVE

## 2017-11-23 LAB — HEPATITIS C ANTIBODY
Hepatitis C Ab: REACTIVE — AB
SIGNAL TO CUT-OFF: 39 — ABNORMAL HIGH (ref <1.00)

## 2017-11-23 LAB — PROTIME-INR
INR: 1
Prothrombin Time: 10 s (ref 9.0–11.5)

## 2017-11-23 LAB — HEPATITIS C RNA QUANTITATIVE
HCV Quantitative Log: 4.38 Log IU/mL — ABNORMAL HIGH
HCV RNA, PCR, QN: 23800 IU/mL — ABNORMAL HIGH

## 2017-11-23 LAB — HEPATITIS C GENOTYPE

## 2017-11-23 LAB — AFP TUMOR MARKER: AFP-Tumor Marker: 2.8 ng/mL

## 2017-11-23 LAB — HIV ANTIBODY (ROUTINE TESTING W REFLEX): HIV 1&2 Ab, 4th Generation: NONREACTIVE

## 2017-11-26 ENCOUNTER — Telehealth (INDEPENDENT_AMBULATORY_CARE_PROVIDER_SITE_OTHER): Payer: Self-pay | Admitting: Internal Medicine

## 2017-11-26 NOTE — Telephone Encounter (Signed)
Patient called about lab results. 

## 2017-11-26 NOTE — Telephone Encounter (Signed)
Results given to patient. Waiting on [place].

## 2017-11-27 ENCOUNTER — Ambulatory Visit (HOSPITAL_COMMUNITY)
Admission: RE | Admit: 2017-11-27 | Discharge: 2017-11-27 | Disposition: A | Discharge status: Home / Self Care | Payer: Medicaid Other | Source: Ambulatory Visit | Attending: Internal Medicine | Admitting: Internal Medicine

## 2017-11-27 DIAGNOSIS — K824 Cholesterolosis of gallbladder: Secondary | ICD-10-CM | POA: Diagnosis not present

## 2017-11-27 DIAGNOSIS — B1920 Unspecified viral hepatitis C without hepatic coma: Secondary | ICD-10-CM | POA: Diagnosis not present

## 2017-11-28 ENCOUNTER — Telehealth (INDEPENDENT_AMBULATORY_CARE_PROVIDER_SITE_OTHER): Payer: Self-pay | Admitting: Internal Medicine

## 2017-11-28 NOTE — Telephone Encounter (Signed)
[name], M[place] x 8 weeks.  

## 2017-11-29 NOTE — Telephone Encounter (Signed)
A PA has been completed for the patient. Once we have heard from the PA we will contact the patient.

## 2018-02-18 ENCOUNTER — Ambulatory Visit (INDEPENDENT_AMBULATORY_CARE_PROVIDER_SITE_OTHER): Payer: Self-pay | Admitting: Internal Medicine

## 2018-02-19 ENCOUNTER — Encounter (INDEPENDENT_AMBULATORY_CARE_PROVIDER_SITE_OTHER): Payer: Self-pay

## 2018-02-20 ENCOUNTER — Encounter (INDEPENDENT_AMBULATORY_CARE_PROVIDER_SITE_OTHER): Payer: Self-pay | Admitting: Internal Medicine

## 2018-02-22 ENCOUNTER — Telehealth (INDEPENDENT_AMBULATORY_CARE_PROVIDER_SITE_OTHER): Payer: Self-pay | Admitting: Internal Medicine

## 2018-02-22 DIAGNOSIS — B1710 Acute hepatitis C without hepatic coma: Secondary | ICD-10-CM

## 2018-02-22 NOTE — Telephone Encounter (Signed)
Tammy, please send letter that she needs her lab work drawn.  I have put labs in  CBC,Hepatic, Hep C quaint.

## 2018-02-25 ENCOUNTER — Encounter (INDEPENDENT_AMBULATORY_CARE_PROVIDER_SITE_OTHER): Payer: Self-pay | Admitting: *Deleted

## 2018-02-25 NOTE — Telephone Encounter (Signed)
A letter has been sent to the patient.

## 2019-12-18 ENCOUNTER — Encounter: Payer: Self-pay | Admitting: Physical Therapy

## 2019-12-18 ENCOUNTER — Ambulatory Visit: Payer: Medicaid Other | Attending: Psychiatry | Admitting: Physical Therapy

## 2019-12-18 ENCOUNTER — Other Ambulatory Visit: Payer: Self-pay

## 2019-12-18 DIAGNOSIS — R262 Difficulty in walking, not elsewhere classified: Secondary | ICD-10-CM | POA: Insufficient documentation

## 2019-12-18 DIAGNOSIS — R42 Dizziness and giddiness: Secondary | ICD-10-CM | POA: Diagnosis present

## 2019-12-18 DIAGNOSIS — R2681 Unsteadiness on feet: Secondary | ICD-10-CM | POA: Diagnosis present
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
## 2019-12-24 ENCOUNTER — Other Ambulatory Visit: Payer: Self-pay

## 2019-12-24 ENCOUNTER — Encounter: Payer: Self-pay | Admitting: Physical Therapy

## 2019-12-24 ENCOUNTER — Ambulatory Visit: Payer: Medicaid Other | Admitting: Physical Therapy

## 2019-12-24 DIAGNOSIS — R2681 Unsteadiness on feet: Secondary | ICD-10-CM

## 2019-12-24 DIAGNOSIS — R42 Dizziness and giddiness: Secondary | ICD-10-CM

## 2019-12-24 DIAGNOSIS — R262 Difficulty in walking, not elsewhere classified: Secondary | ICD-10-CM

## 2019-12-24 NOTE — Patient Instructions (Signed)
Access Code: [email or web address] URL: [email or web address] Date: 12/24/2019 Prepared by: Sallyanne Kuster  Exercises Standing Near Stance in [place] with Eyes Closed - 1 x daily - 5 x weekly - 1 sets - 3 reps - 30 hold Standing Balance in Corner with Eyes Closed - 1 x daily - 5 x weekly - 1 sets - 10 reps Wide Tandem Stance with Eyes Closed - 1 x daily - 5 x weekly - 1 sets - 2-3 reps - 30 hold Wide Stance with Head Nods on Foam Pad - 1 x daily - 5 x weekly - 1 sets - 10 reps
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
## 2019-12-26 ENCOUNTER — Encounter: Payer: Self-pay | Admitting: Physical Therapy

## 2019-12-26 ENCOUNTER — Other Ambulatory Visit: Payer: Self-pay

## 2019-12-26 ENCOUNTER — Ambulatory Visit: Payer: Medicaid Other | Admitting: Physical Therapy

## 2019-12-26 DIAGNOSIS — R42 Dizziness and giddiness: Secondary | ICD-10-CM | POA: Diagnosis not present

## 2019-12-26 DIAGNOSIS — R262 Difficulty in walking, not elsewhere classified: Secondary | ICD-10-CM

## 2019-12-26 DIAGNOSIS — R2681 Unsteadiness on feet: Secondary | ICD-10-CM

## 2019-12-26 NOTE — Patient Instructions (Signed)
Access Code: [email or web address] URL: [email or web address] Date: 12/26/2019 Prepared by: Sallyanne Kuster  Exercises Standing Near Stance in [place] with Eyes Closed - 1 x daily - 5 x weekly - 1 sets - 3 reps - 30 hold Standing Balance in Corner with Eyes Closed - 1 x daily - 5 x weekly - 1 sets - 10 reps Wide Tandem Stance with Eyes Closed - 1 x daily - 5 x weekly - 1 sets - 2-3 reps - 30 hold Wide Stance with Head Nods on Foam Pad - 1 x daily - 5 x weekly - 1 sets - 10 reps  Added to HEP today:  Tandem Walking with Counter Support - 1 x daily - 5 x weekly - 1 sets - 4 reps Backward Walking with Counter Support - 1 x daily - 5 x weekly - 1 sets - 4 reps Walking with Head Nod - 1 x daily - 5 x weekly - 1 sets - 4 reps Walking with Head Rotation - 1 x daily - 5 x weekly - 1 sets - 4 reps Walking with Eyes Closed and Counter Support - 1 x daily - 5 x weekly - 1 sets - 4 reps
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
## 2019-12-29 ENCOUNTER — Ambulatory Visit: Payer: Medicaid Other | Admitting: Physical Therapy

## 2019-12-29 ENCOUNTER — Other Ambulatory Visit: Payer: Self-pay

## 2019-12-29 DIAGNOSIS — R42 Dizziness and giddiness: Secondary | ICD-10-CM | POA: Diagnosis not present

## 2019-12-29 DIAGNOSIS — R2681 Unsteadiness on feet: Secondary | ICD-10-CM

## 2019-12-30 ENCOUNTER — Encounter: Payer: Self-pay | Admitting: Physical Therapy
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
## 2020-01-01 ENCOUNTER — Ambulatory Visit: Payer: Medicaid Other | Admitting: Physical Therapy

## 2020-01-05 ENCOUNTER — Ambulatory Visit: Payer: Medicaid Other | Admitting: Physical Therapy

## 2020-01-12 ENCOUNTER — Other Ambulatory Visit: Payer: Self-pay

## 2020-01-12 ENCOUNTER — Ambulatory Visit: Payer: Medicaid Other | Admitting: Physical Therapy

## 2020-01-12 ENCOUNTER — Encounter: Payer: Self-pay | Admitting: Physical Therapy

## 2020-01-12 DIAGNOSIS — R262 Difficulty in walking, not elsewhere classified: Secondary | ICD-10-CM

## 2020-01-12 DIAGNOSIS — R2681 Unsteadiness on feet: Secondary | ICD-10-CM

## 2020-01-12 DIAGNOSIS — R42 Dizziness and giddiness: Secondary | ICD-10-CM | POA: Diagnosis not present
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
## 2020-01-20 ENCOUNTER — Ambulatory Visit: Payer: Medicaid Other | Admitting: Physical Therapy

## 2020-01-22 ENCOUNTER — Ambulatory Visit: Payer: Medicaid Other | Admitting: Physical Therapy

## 2020-01-27 ENCOUNTER — Ambulatory Visit: Payer: Medicaid Other | Admitting: Physical Therapy

## 2020-01-29 ENCOUNTER — Ambulatory Visit: Payer: Medicaid Other | Admitting: Physical Therapy

## 2020-02-03 ENCOUNTER — Other Ambulatory Visit: Payer: Self-pay

## 2020-02-03 ENCOUNTER — Ambulatory Visit: Payer: Medicaid Other | Attending: Psychiatry | Admitting: Physical Therapy

## 2020-02-03 ENCOUNTER — Encounter: Payer: Self-pay | Admitting: Physical Therapy

## 2020-02-03 DIAGNOSIS — R262 Difficulty in walking, not elsewhere classified: Secondary | ICD-10-CM | POA: Insufficient documentation

## 2020-02-03 DIAGNOSIS — R2681 Unsteadiness on feet: Secondary | ICD-10-CM | POA: Diagnosis present

## 2020-02-03 DIAGNOSIS — R42 Dizziness and giddiness: Secondary | ICD-10-CM | POA: Insufficient documentation
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
## 2020-02-05 ENCOUNTER — Other Ambulatory Visit: Payer: Self-pay

## 2020-02-05 ENCOUNTER — Ambulatory Visit: Payer: Medicaid Other | Admitting: Physical Therapy

## 2020-02-05 DIAGNOSIS — R262 Difficulty in walking, not elsewhere classified: Secondary | ICD-10-CM

## 2020-02-05 DIAGNOSIS — R2681 Unsteadiness on feet: Secondary | ICD-10-CM | POA: Diagnosis not present

## 2020-02-05 DIAGNOSIS — R42 Dizziness and giddiness: Secondary | ICD-10-CM
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
## 2021-05-24 ENCOUNTER — Encounter: Payer: Self-pay | Admitting: Physical Therapy

## 2021-05-24 ENCOUNTER — Ambulatory Visit: Payer: Medicaid Other | Attending: Orthopedic Surgery | Admitting: Physical Therapy

## 2021-05-24 DIAGNOSIS — M25672 Stiffness of left ankle, not elsewhere classified: Secondary | ICD-10-CM | POA: Diagnosis present

## 2021-05-24 DIAGNOSIS — M25572 Pain in left ankle and joints of left foot: Secondary | ICD-10-CM | POA: Insufficient documentation

## 2021-05-24 NOTE — Therapy (Signed)
[place] [facility] ?[address] ?[place], [place], [address] ?Phone: [phone]   Fax:  [phone] ? ?Physical Therapy Evaluation ? ?Patient Details  ?Name: [name] ?MRN: [id number] ?Date of Birth: April 02, 1985 ?Referring Provider (PT): Arturo Morton MD ? ? ?Encounter Date: 05/24/2021 ? ? PT End of Session - 05/24/21 1407   ? ? Visit Number 1   ? Number of Visits 12   ? Date for PT Re-Evaluation 07/05/21   ? PT Start Time 0143   ? PT Stop Time 0207   ? PT Time Calculation (min) 24 min   ? Activity Tolerance Patient tolerated treatment well   ? Behavior During Therapy [facility] for tasks assessed/performed   ? ?  ?  ? ?  ? ? ?Past Medical History:  ?Diagnosis Date  ? Anxiety   ? Asthma   ? no current med.  ? Depression   ? Hand laceration involving tendon 12/13/2014  ? right  ? Headache   ? Open metacarpal fracture 12/13/2014  ? right  ? PTSD (post-traumatic stress disorder)   ? ? ?Past Surgical History:  ?Procedure Laterality Date  ? BONE TUMOR EXCISION Left   ? great toe  ? CESAREAN SECTION  03/02/2013  ? DILATION AND CURETTAGE OF UTERUS  01/12/2006  ? suction D & C  ? HAND SURGERY    ? right  ? I & D EXTREMITY Right 12/17/2014  ? Procedure: RIGHT HAND IRRIGATION AND DEBRIDEMENT,;  Surgeon: Betha Loa, MD;  Location: [facility];  Service: Orthopedics;  Laterality: Right;  ? REPAIR EXTENSOR TENDON Right 12/17/2014  ? Procedure: REPAIR EXTENSOR TENDON;  Surgeon: Betha Loa, MD;  Location: [facility];  Service: Orthopedics;  Laterality: Right;  ? TUBAL LIGATION    ? WISDOM TOOTH EXTRACTION    ? ? ?There were no vitals filed for this visit. ? ? ? Subjective Assessment - 05/24/21 1414   ? ? Subjective The patient presents to the clinic today with a diagnosis of non-displaced fractures of proximal phalanx of lesse toes and sprain of her left anterior talofibular ligament.  She sustiained this injury on 04/26/21 while going upstairs and tripping.  She is in  her CAM boot today walking without assistive device and weight bearing as tolerated over her left lower extremity.  Her resting pain-level today is a 1/10 and can rise to a 5/10 if she is up alot.  Staying off her feet and elevating her feet decreases her pain.  Standing on toes to reach for something and walking too much increase her pain.   ? Pertinent History Right hand surgery, H/o vertigo (she has had treatment for in the past).   ? How long can you sit comfortably? Unlimited.   ? How long can you stand comfortably? Varies.   ? How long can you walk comfortably? Varies.   ? Patient Stated Goals Get back to normal and walk without pain.   ? Currently in Pain? Yes   ? Pain Score 1    ? Pain Location Ankle   ? Pain Orientation Left   ? Pain Descriptors / Indicators Sore   ? Pain Type Acute pain   ? Pain Onset 1 to 4 weeks ago   ? Pain Frequency Constant   ? Aggravating Factors  See above.   ? Pain Relieving Factors See above.   ? ?  ?  ? ?  ? ? ? ? ? [facility] PT Assessment - 05/24/21  0001   ? ?  ? Assessment  ? Medical Diagnosis Non-displaced fracture of proximal phalanx of lesser toes, sprain of ATFL of left ankle.   ? Referring Provider (PT) Arturo MortonSnow Daws MD   ? Onset Date/Surgical Date 04/26/21   ?  ? Precautions  ? Precaution Comments Currently in left CAM boot.   ?  ? Restrictions  ? Weight Bearing Restrictions No   ?  ? Balance Screen  ? Has the patient fallen in the past 6 months Yes   ? How many times? 3.  Patient reports a h/o vertigo and has had treatment for in the past.  She has a future MD appointment as well.   ?  ? Home Environment  ? Living Environment Private residence   ?  ? Prior Function  ? Level of Independence Independent   ?  ? Observation/Other Assessments-Edema   ? Edema --   Minimal localized edema in the region of the left lateral ankle ligaments.  ?  ? ROM / Strength  ? AROM / PROM / Strength AROM   ?  ? AROM  ? Overall AROM Comments Active left ankle dorsiflexion to neutral with left knee in  full extension and 5 degrees with knee flexed.   ?  ? Palpation  ? Palpation comment Very tender to palpation over patient's left ATFL.   ?  ? Ambulation/Gait  ? Gait Comments WBAT over left LE with CAM boot donned.   ? ?  ?  ? ?  ? ? ? ? ? ? ? ? ? ? ? ? ? ?Objective measurements completed on examination: See above findings.  ? ? ? ? ? ? ? ? ? ? ? ? ? ? ? ? ? ? ? PT Long Term Goals - 05/24/21 1511   ? ?  ? PT LONG TERM GOAL #1  ? Title Independent with a HEP.   ? Baseline No knowledge of appropriate ther ex.   ? Time 6   ? Period Weeks   ? Status New   ?  ? PT LONG TERM GOAL #2  ? Title Increase left ankle dorsiflexion to 8 degrees to normalize the patient?s gait pattern.   ? Baseline 0 degrees.   ? Time 6   ? Period Weeks   ? Status New   ?  ? PT LONG TERM GOAL #3  ? Title Walk a community distance without left ankle bracing with pain not > 2/10.   ? Baseline Patient currently in CAM boot.   ? Time 6   ? Period Weeks   ? Status New   ? ?  ?  ? ?  ? ? ? ? ? ? ? ? ? Plan - 05/24/21 1501   ? ? Clinical Impression Statement The patient presents to OPP[facility] s/p left non-displaced fracture of left proximal phalanx fracture and a sprain to her left ATFL.  She is currently in a CAM boot and is wbat over her left LE without assisitve device.  She was found to have an expected loss of active dorsiflexion.  Her edema was minimal localized to her left lateral ankel ligaments.  She is very palpably tender over her left ATFL.  Patient will benefit from skilled physical therapy intervention to address pain and deficits.   ? Personal Factors and Comorbidities Comorbidity 1;Other   ? Comorbidities Right hand surgery, H/o vertigo (she has had treatment for in the past).   ? Examination-Activity Limitations Other   ?  Examination-Participation Restrictions Other   ? Stability/Clinical Decision Making Stable/Uncomplicated   ? Clinical Decision Making Low   ? Rehab Potential Excellent   ? PT Frequency 2x / week   ? PT Duration 6 weeks   ?  PT Treatment/Interventions ADLs/Self Care Home Management;Gait training;Stair training;Functional mobility training;Therapeutic activities;Therapeutic exercise;Manual techniques   ? PT Next Visit Plan Ankle pumps, begin with Nustep and seated Rockerboard when out of boot, manual calf stretching.  STW/M as needed.   ? Consulted and Agree with Plan of Care Patient   ? ?  ?  ? ?  ? ? ?Patient will benefit from skilled therapeutic intervention in order to improve the following deficits and impairments:  Abnormal gait, Pain, Increased edema, Difficulty walking, Decreased range of motion ? ?Visit Diagnosis: ?Pain in left ankle and joints of left foot - Plan: PT plan of care cert/re-cert ? ?Stiffness of left ankle, not elsewhere classified - Plan: PT plan of care cert/re-cert ? ? ? ? ?Problem List ?Patient Active Problem List  ? Diagnosis Date Noted  ? Severe recurrent major depression without psychotic features (HCC) 01/13/2017  ? GERD (gastroesophageal reflux disease) 01/13/2017  ? Alcohol intoxication, uncomplicated (HCC)   ? Polysubstance dependence, non-opioid, continuous (HCC) 06/27/2016  ? Suicidal ideations 06/27/2016  ? Major depressive disorder, single episode, severe, without mention of psychotic behavior   ? PTSD (post-traumatic stress disorder) 12/30/2014  ? Severe major depression without psychotic features (HCC) 12/29/2014  ? Alcohol use disorder, moderate, dependence (HCC) 12/29/2014  ? UTI (lower urinary tract infection) 07/21/2014  ? Hypokalemia 07/21/2014  ? Hyperglycemia 07/21/2014  ? Pyelonephritis 07/21/2014  ? ? ?Alvenia Treese, [place], PT ?05/24/2021, 3:20 PM ? ?[place] ?[facility] ?[address] ?[place], [place], [address] ?Phone: [phone]   Fax:  [phone] ? ?Name: [name] ?MRN: [id number] ?Date of Birth: 1985/03/22 ? ? ?

## 2022-09-05 ENCOUNTER — Encounter (HOSPITAL_COMMUNITY): Payer: Self-pay | Admitting: Emergency Medicine

## 2022-09-05 ENCOUNTER — Emergency Department (HOSPITAL_COMMUNITY): Payer: MEDICAID

## 2022-09-05 ENCOUNTER — Other Ambulatory Visit: Payer: Self-pay

## 2022-09-05 ENCOUNTER — Emergency Department (HOSPITAL_COMMUNITY)
Admission: EM | Admit: 2022-09-05 | Discharge: 2022-09-05 | Disposition: A | Discharge status: Home / Self Care | Payer: MEDICAID | Attending: Emergency Medicine | Admitting: Emergency Medicine

## 2022-09-05 DIAGNOSIS — J45909 Unspecified asthma, uncomplicated: Secondary | ICD-10-CM | POA: Insufficient documentation

## 2022-09-05 DIAGNOSIS — Z79899 Other long term (current) drug therapy: Secondary | ICD-10-CM | POA: Insufficient documentation

## 2022-09-05 DIAGNOSIS — I10 Essential (primary) hypertension: Secondary | ICD-10-CM | POA: Diagnosis not present

## 2022-09-05 DIAGNOSIS — R079 Chest pain, unspecified: Secondary | ICD-10-CM | POA: Diagnosis not present

## 2022-09-05 DIAGNOSIS — Z91040 Latex allergy status: Secondary | ICD-10-CM | POA: Insufficient documentation

## 2022-09-05 LAB — CBC
HCT: 41.8 % (ref 36.0–46.0)
Hemoglobin: 13.8 g/dL (ref 12.0–15.0)
MCH: 28.2 pg (ref 26.0–34.0)
MCHC: 33 g/dL (ref 30.0–36.0)
MCV: 85.5 fL (ref 80.0–100.0)
Platelets: 337 10*3/uL (ref 150–400)
RBC: 4.89 MIL/uL (ref 3.87–5.11)
RDW: 13 % (ref 11.5–15.5)
WBC: 7.9 10*3/uL (ref 4.0–10.5)
nRBC: 0 % (ref 0.0–0.2)

## 2022-09-05 LAB — BASIC METABOLIC PANEL
Anion gap: 9 (ref 5–15)
BUN: 8 mg/dL (ref 6–20)
CO2: 24 mmol/L (ref 22–32)
Calcium: 8.9 mg/dL (ref 8.9–10.3)
Chloride: 102 mmol/L (ref 98–111)
Creatinine, Ser: 0.71 mg/dL (ref 0.44–1.00)
GFR, Estimated: >60 mL/min (ref >60)
Glucose, Bld: 89 mg/dL (ref 70–99)
Potassium: 3.5 mmol/L (ref 3.5–5.1)
Sodium: 135 mmol/L (ref 135–145)

## 2022-09-05 LAB — TROPONIN I (HIGH SENSITIVITY): Troponin I (High Sensitivity): <2 ng/L (ref <18)

## 2022-09-05 NOTE — ED Triage Notes (Addendum)
Pt via POV c/o chest pain and HTN since earlier today with SOB. Pt took tums with no relief. BP at home was 160/128 with baseline BP closer to 150/100 in recent weeks. No prior hx HTN. Pt notes she was prescribed propranolol for anxiety and took one tablet this morning and another this afternoon due to elevated BP.

## 2022-09-05 NOTE — Discharge Instructions (Addendum)
Follow your blood pressure at home.  Check it once a day and follow-up with your doctor.

## 2022-09-05 NOTE — ED Provider Notes (Signed)
[facility] EMERGENCY DEPARTMENT AT [facility] Provider Note   CSN: [id number] Arrival date & time: 09/05/22  1359     History  Chief Complaint  Patient presents with   Chest Pain    [name] is a 37 y.o. female.   Chest Pain Patient presents with high blood pressure.  States she has been feeling bad for a long time now.  Had some shortness of breath and chest tightness.  States blood pressure at home was 160/128.  Then had it checked at the doctor's office with the machine and it wasSimilar level.  Has been checking at home and has been running 150/100.  Is on propranolol for anxiety and reportedly had been told she could take it for the blood pressure.  States some chest tightness to go to her left neck.  No numbness weakness.  No fevers.  No previous known cardiac disease.    Past Medical History:  Diagnosis Date   Anxiety    Asthma    no current med.   Depression    Hand laceration involving tendon 12/13/2014   right   Headache    Open metacarpal fracture 12/13/2014   right   PTSD (post-traumatic stress disorder)     Home Medications Prior to Admission medications   Medication Sig Start Date End Date Taking? Authorizing Provider  albuterol (PROVENTIL HFA;VENTOLIN HFA) 108 (90 Base) MCG/ACT inhaler Inhale 1-2 puffs into the lungs every 6 (six) hours as needed for wheezing or shortness of breath. 06/30/16 06/30/17  Armandina Stammer I, NP  amphetamine-dextroamphetamine (ADDERALL XR) 20 MG 24 hr capsule Take 20 mg by mouth 2 (two) times daily. As needed    [provider]  buPROPion (WELLBUTRIN XL) 300 MG 24 hr tablet Take 300 mg by mouth daily.    [provider]  Ergocalciferol (VITAMIN D2 PO) Take by mouth. 1.25  50,000 unit once a week.    [provider]  FLUoxetine (PROZAC) 20 MG capsule Take 1 capsule (20 mg total) by mouth daily. For depression Patient taking differently: Take 40 mg by mouth daily. For depression 07/01/16    Armandina Stammer I, NP  lamoTRIgine (LAMICTAL) 100 MG tablet Take 1 tablet (100 mg total) by mouth daily. For mood control Patient not taking: Reported on 12/18/2019 01/16/17   Money, Gerlene Burdock, FNP  OXcarbazepine (TRILEPTAL) 150 MG tablet Take 150 mg by mouth 2 (two) times daily. Patient not taking: Reported on 12/18/2019    [provider]  Solriamfetol HCl (SUNOSI) 75 MG TABS Take 75 mg by mouth.    [provider]  topiramate (TOPAMAX) 100 MG tablet Take 100 mg by mouth 2 (two) times daily. Patient not taking: Reported on 12/18/2019    [provider]      Allergies    Prednisone, Hydrocodone-acetaminophen, and Latex    Review of Systems   Review of Systems  Cardiovascular:  Positive for chest pain.    Physical Exam Updated Vital Signs BP (!) 138/95 (BP Location: Right Arm)   Pulse 78   Temp 98.9 F (37.2 C) (Oral)   Resp 18   Ht 5\' 3"  (1.6 m)   Wt 90.7 kg   LMP 08/26/2022 (Approximate)   SpO2 99%   BMI 35.43 kg/m  Physical Exam Vitals and nursing note reviewed.  Pulmonary:     Breath sounds: No wheezing.  Chest:     Chest wall: No tenderness.  Abdominal:     Tenderness: There is  no abdominal tenderness.  Musculoskeletal:     Cervical back: Neck supple.     Right lower leg: No edema.     Left lower leg: No edema.  Neurological:     Mental Status: She is alert.     ED Results / Procedures / Treatments   Labs (all labs ordered are listed, but only abnormal results are displayed) Labs Reviewed  BASIC METABOLIC PANEL  CBC  POC URINE PREG, ED  TROPONIN I (HIGH SENSITIVITY)    EKG EKG Interpretation Date/Time:  Tuesday September 05 2022 14:20:48 EDT Ventricular Rate:  88 PR Interval:  130 QRS Duration:  74 QT Interval:  352 QTC Calculation: 425 R Axis:   55  Text Interpretation: Normal sinus rhythm Normal ECG No previous ECGs available Confirmed by Cathren Laine ([id number]) on 09/05/2022 2:32:09 PM  Radiology DG Chest 2 View  Result Date:  09/05/2022 CLINICAL DATA:  Chest pain EXAM: CHEST - 2 VIEW COMPARISON:  Chest x-ray 07/21/2014 FINDINGS: The heart size and mediastinal contours are within normal limits. Both lungs are clear. The visualized skeletal structures are unremarkable. IMPRESSION: No active cardiopulmonary disease. Electronically Signed   By: Darliss Cheney M.D.   On: 09/05/2022 15:17    Procedures Procedures    Medications Ordered in ED Medications - No data to display  ED Course/ Medical Decision Making/ A&P                             Medical Decision Making Amount and/or Complexity of Data Reviewed Labs: ordered. Radiology: ordered.   Patient with hypertension and chest pain.  Has had for last couple days with the chest pain but has felt bad for a while.  Has had high blood pressure for a few weeks.  Is on propranolol for anxiety.  No fevers or chills.  No cough.  Slight shortness of breath but also feeling more anxious.  Differential diagnosis includes anxiety, hypertension, chest pain, ischemia.  PE felt less likely.  EKG reassuring.  Will get x-ray and basic blood work.  Blood work and need EKG reassuring.  Chest x-ray reassuring.  Blood pressures come down to 140/95.  Still somewhat elevated but do not think any do acutely change medicine at this time.  States that until 2 weeks ago her blood pressure tend to run low.  Has follow-up with PCP.  Will check blood pressure at home but appears stable for discharge.        Final Clinical Impression(s) / ED Diagnoses Final diagnoses:  Nonspecific chest pain  Hypertension, unspecified type    Rx / DC Orders ED Discharge Orders     None         Benjiman Core, MD 09/05/22 [phone]
